# Patient Record
Sex: Female | Born: 2000 | Race: White | Hispanic: No | Marital: Single | Smoking: Never smoker
Health system: Southern US, Community
[De-identification: ages and names within clinical notes are randomized; demographics above are authoritative.]

---
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
## 2020-01-17 DIAGNOSIS — Z419 Encounter for procedure for purposes other than remedying health state, unspecified: Secondary | ICD-10-CM | POA: Diagnosis not present

## 2020-02-17 DIAGNOSIS — Z419 Encounter for procedure for purposes other than remedying health state, unspecified: Secondary | ICD-10-CM | POA: Diagnosis not present

## 2020-03-19 DIAGNOSIS — Z419 Encounter for procedure for purposes other than remedying health state, unspecified: Secondary | ICD-10-CM | POA: Diagnosis not present

## 2020-04-16 DIAGNOSIS — Z419 Encounter for procedure for purposes other than remedying health state, unspecified: Secondary | ICD-10-CM | POA: Diagnosis not present

## 2020-05-17 DIAGNOSIS — Z419 Encounter for procedure for purposes other than remedying health state, unspecified: Secondary | ICD-10-CM | POA: Diagnosis not present

## 2020-06-01 ENCOUNTER — Emergency Department
Admission: EM | Admit: 2020-06-01 | Discharge: 2020-06-01 | Disposition: A | Discharge status: Home / Self Care | Payer: Medicaid Other | Attending: Emergency Medicine | Admitting: Emergency Medicine

## 2020-06-01 ENCOUNTER — Other Ambulatory Visit: Payer: Self-pay

## 2020-06-01 ENCOUNTER — Emergency Department: Payer: Medicaid Other

## 2020-06-01 ENCOUNTER — Encounter: Payer: Self-pay | Admitting: Radiology

## 2020-06-01 DIAGNOSIS — M79605 Pain in left leg: Secondary | ICD-10-CM | POA: Insufficient documentation

## 2020-06-01 DIAGNOSIS — Y92009 Unspecified place in unspecified non-institutional (private) residence as the place of occurrence of the external cause: Secondary | ICD-10-CM | POA: Diagnosis not present

## 2020-06-01 DIAGNOSIS — M549 Dorsalgia, unspecified: Secondary | ICD-10-CM | POA: Diagnosis not present

## 2020-06-01 DIAGNOSIS — X500XXA Overexertion from strenuous movement or load, initial encounter: Secondary | ICD-10-CM | POA: Insufficient documentation

## 2020-06-01 DIAGNOSIS — M545 Low back pain, unspecified: Secondary | ICD-10-CM | POA: Diagnosis not present

## 2020-06-01 LAB — POC URINE PREG, ED: Preg Test, Ur: NEGATIVE

## 2020-06-01 MED ORDER — ACETAMINOPHEN 325 MG PO TABS
650.0000 mg | ORAL_TABLET | Freq: Once | ORAL | Status: AC
  Administered 2020-06-01: 650 mg via ORAL
  Filled 2020-06-01: qty 2

## 2020-06-01 MED ORDER — MELOXICAM 7.5 MG PO TABS
15.0000 mg | ORAL_TABLET | Freq: Once | ORAL | Status: AC
  Administered 2020-06-01: 15 mg via ORAL
  Filled 2020-06-01: qty 2

## 2020-06-01 MED ORDER — METHOCARBAMOL 500 MG PO TABS
750.0000 mg | ORAL_TABLET | Freq: Once | ORAL | Status: AC
  Administered 2020-06-01: 750 mg via ORAL
  Filled 2020-06-01: qty 2

## 2020-06-01 MED ORDER — MELOXICAM 15 MG PO TABS: 15.0000 mg | ORAL_TABLET | Freq: Every day | ORAL | 0 refills | Status: AC

## 2020-06-01 MED ORDER — METHOCARBAMOL 750 MG PO TABS: 750.0000 mg | ORAL_TABLET | Freq: Four times a day (QID) | ORAL | 0 refills | Status: AC | PRN

## 2020-06-01 NOTE — Discharge Instructions (Addendum)
Please take anti-inflammatory and muscle relaxant as prescribed.  You may also take Tylenol, up to 1000 mg 4 times daily as needed for pain.  These follow-up with primary care if not improving, or return to the emergency department with any worsening.

## 2020-06-01 NOTE — ED Triage Notes (Signed)
Pt comes pov with left hip pain to knee that started a week ago. Denies injury.

## 2020-06-01 NOTE — ED Notes (Signed)
Called and notified imaging staff that urine preg neg since not yet transferred over to chart. Stated they'll come get pt soon.

## 2020-06-01 NOTE — ED Notes (Signed)
Urine preg NEG. Unsure why result not transferring over to chart.

## 2020-06-01 NOTE — ED Provider Notes (Signed)
[facility] Emergency Department Provider Note  ____________________________________________   Event Date/Time   First MD Initiated Contact with Patient 06/01/20 1739     (approximate)  I have reviewed the triage vital signs and the nursing notes.   HISTORY  Chief Complaint Leg Pain   HPI Michele Velazquez is a 20 y.o. female who presents to the emergency department for evaluation of left leg pain.  Patient states that pain has been present for about a week and radiates from the left hip down to the left knee.  She states that it started after moving heavy furniture at her home.  She states that her back "does not hurt that bad" but she does endorse some associated back pain.  She has tried a single dose of Tylenol and a single dose of ibuprofen without improvement in her symptoms.  She reports that pain is worse when up and moving, improved with sitting in a crosslegged position.  She denies any fever, denies any loss of bowel or bladder control.  Denies any numbness or tingling or weakness.        History reviewed. No pertinent past medical history.  There are no problems to display for this patient.   No past surgical history on file.  Prior to Admission medications   Medication Sig Start Date End Date Taking? Authorizing Provider  meloxicam (MOBIC) 15 MG tablet Take 1 tablet (15 mg total) by mouth daily for 15 days. 06/01/20 06/16/20 Yes Newell Wafer, Ruben Gottron, PA  methocarbamol (ROBAXIN-750) 750 MG tablet Take 1 tablet (750 mg total) by mouth 4 (four) times daily as needed for up to 10 days for muscle spasms. 06/01/20 06/11/20 Yes Kaydence Baba, Ruben Gottron, PA  acyclovir (ZOVIRAX) 800 MG tablet Take 1 tablet (800 mg total) by mouth 3 (three) times daily. 01/30/19   Fisher, Roselyn Bering, PA-C  predniSONE (STERAPRED UNI-PAK 21 TAB) 10 MG (21) TBPK tablet Take 6 pills on day one then decrease by 1 pill each day 01/30/19   Faythe Ghee, PA-C    Allergies Penicillins,  Sulfa antibiotics, and Bactrim [sulfamethoxazole-trimethoprim]  No family history on file.  Social History Social History   Tobacco Use  . Smoking status: Never Smoker  . Smokeless tobacco: Never Used  Substance Use Topics  . Alcohol use: No    Review of Systems  Constitutional: No fever/chills Eyes: No visual changes. ENT: No sore throat. Cardiovascular: Denies chest pain. Respiratory: Denies shortness of breath. Gastrointestinal: No abdominal pain.  No nausea, no vomiting.  No diarrhea.  No constipation. Genitourinary: Negative for dysuria. Musculoskeletal: + Left leg pain, back pain Skin: Negative for rash. Neurological: Negative for headaches, focal weakness or numbness.   ____________________________________________   PHYSICAL EXAM:  VITAL SIGNS: ED Triage Vitals  Enc Vitals Group     BP 06/01/20 1736 (!) 179/100     Pulse Rate 06/01/20 1736 (!) 120     Resp 06/01/20 1736 18     Temp 06/01/20 1736 97.6 F (36.4 C)     Temp Source 06/01/20 1736 Oral     SpO2 06/01/20 1736 96 %     Weight 06/01/20 1736 (!) 336 lb (152.4 kg)     Height 06/01/20 1736 5\' 6"  (1.676 m)     Head Circumference --      Peak Flow --      Pain Score 06/01/20 1735 10     Pain Loc --      Pain Edu? --  Excl. in GC? --    Constitutional: Alert and oriented. Well appearing and in no acute distress. Eyes: Conjunctivae are normal. PERRL. EOMI. Head: Atraumatic. Nose: No congestion/rhinnorhea. Mouth/Throat: Mucous membranes are moist.  Neck: No stridor.   Cardiovascular: Normal rate, regular rhythm. Grossly normal heart sounds.  Good peripheral circulation. Respiratory: Normal respiratory effort.  No retractions. Lungs CTAB. Gastrointestinal: Soft and nontender. No distention. No abdominal bruits. No CVA tenderness. Musculoskeletal: There is tenderness noted to the midline and paraspinals of the lumbar spine.  Pain of the left leg is not reproducible to palpation.  Negative logroll  bilaterally.  Full range of motion of the hip knee and ankle bilaterally.  5/5 strength in the bilateral lower extremities and ankle plantarflexion and dorsiflexion, knee flexion and extension. Neurologic:  Normal speech and language. No gross focal neurologic deficits are appreciated.  Skin:  Skin is warm, dry and intact. No rash noted. Psychiatric: Mood and affect are normal. Speech and behavior are normal.  ____________________________________________   LABS (all labs ordered are listed, but only abnormal results are displayed)  Labs Reviewed  POC URINE PREG, ED   ____________________________________________  RADIOLOGY I, Lucy Chris, personally viewed and evaluated these images (plain radiographs) as part of my medical decision making, as well as reviewing the written report by the radiologist.  ED provider interpretation: No acute findings on lumbar film  Official radiology report(s): DG Lumbar Spine 2-3 Views  Result Date: 06/01/2020 CLINICAL DATA:  Low back pain, no known injury, initial encounter EXAM: LUMBAR SPINE - 3 VIEW COMPARISON:  None. FINDINGS: Five lumbar type vertebral bodies are well visualized. Vertebral body height is well maintained. No anterolisthesis is noted. No soft tissue abnormality is noted. IMPRESSION: No acute abnormality noted. Electronically Signed   By: Alcide Clever M.D.   On: 06/01/2020 20:11    ____________________________________________   INITIAL IMPRESSION / ASSESSMENT AND PLAN / ED COURSE  As part of my medical decision making, I reviewed the following data within the electronic MEDICAL RECORD NUMBER Nursing notes reviewed and incorporated, Radiograph reviewed and Notes from prior ED visits        Patient is a 20 year old female who presents to the emergency department for evaluation of left leg pain that is been persistent over the last week that occurred after moving some furniture.  See HPI for further details.  On physical exam, she is  noted to have tenderness to palpation of the lumbar spine, otherwise exam is grossly within normal limits.  X-ray was obtained of the low back and is negative for acute pathology.  At this time, suspect sciatica as the cause for her radiating leg pain.  Will initiate trial of anti-inflammatories and muscle relaxant and recommended scheduled Tylenol.  Return precautions were discussed, and patient stable at time for outpatient follow-up.      ____________________________________________   FINAL CLINICAL IMPRESSION(S) / ED DIAGNOSES  Final diagnoses:  Left leg pain     ED Discharge Orders         Ordered    methocarbamol (ROBAXIN-750) 750 MG tablet  4 times daily PRN        06/01/20 2023    meloxicam (MOBIC) 15 MG tablet  Daily        06/01/20 2023          *Please note:  Michele Velazquez was evaluated in Emergency Department on 06/01/2020 for the symptoms described in the history of present illness. She was evaluated in the context of  the global COVID-19 pandemic, which necessitated consideration that the patient might be at risk for infection with the SARS-CoV-2 virus that causes COVID-19. Institutional protocols and algorithms that pertain to the evaluation of patients at risk for COVID-19 are in a state of rapid change based on information released by regulatory bodies including the CDC and federal and state organizations. These policies and algorithms were followed during the patient's care in the ED.  Some ED evaluations and interventions may be delayed as a result of limited staffing during and the pandemic.*   Note:  This document was prepared using Dragon voice recognition software and may include unintentional dictation errors.   Lucy Chris, PA 06/01/20 2351    Dionne Bucy, MD 06/02/20 0040

## 2020-06-01 NOTE — ED Notes (Signed)
See triage note. Pt's "mimi" updated with pt's verbal okay. Pt alert and sitting calmly in bed. Family member at bedside. Pt A&Ox4. Resp reg/unlabored currently.

## 2020-06-16 DIAGNOSIS — Z419 Encounter for procedure for purposes other than remedying health state, unspecified: Secondary | ICD-10-CM | POA: Diagnosis not present

## 2020-07-17 DIAGNOSIS — Z419 Encounter for procedure for purposes other than remedying health state, unspecified: Secondary | ICD-10-CM | POA: Diagnosis not present

## 2020-08-16 DIAGNOSIS — Z419 Encounter for procedure for purposes other than remedying health state, unspecified: Secondary | ICD-10-CM | POA: Diagnosis not present

## 2020-09-16 DIAGNOSIS — Z419 Encounter for procedure for purposes other than remedying health state, unspecified: Secondary | ICD-10-CM | POA: Diagnosis not present

## 2020-10-17 DIAGNOSIS — Z419 Encounter for procedure for purposes other than remedying health state, unspecified: Secondary | ICD-10-CM | POA: Diagnosis not present

## 2020-11-08 ENCOUNTER — Emergency Department
Admission: EM | Admit: 2020-11-08 | Discharge: 2020-11-08 | Disposition: A | Discharge status: Home / Self Care | Payer: Medicaid Other | Attending: Emergency Medicine | Admitting: Emergency Medicine

## 2020-11-08 ENCOUNTER — Other Ambulatory Visit: Payer: Self-pay

## 2020-11-08 DIAGNOSIS — M545 Low back pain, unspecified: Secondary | ICD-10-CM | POA: Diagnosis not present

## 2020-11-08 DIAGNOSIS — M5459 Other low back pain: Secondary | ICD-10-CM | POA: Diagnosis not present

## 2020-11-08 LAB — URINALYSIS, COMPLETE (UACMP) WITH MICROSCOPIC
Bilirubin Urine: NEGATIVE
Glucose, UA: NEGATIVE mg/dL
Hgb urine dipstick: NEGATIVE
Ketones, ur: NEGATIVE mg/dL
Leukocytes,Ua: NEGATIVE
Nitrite: NEGATIVE
Protein, ur: >=300 mg/dL — AB
Specific Gravity, Urine: 1.015 (ref 1.005–1.030)
pH: 5 (ref 5.0–8.0)

## 2020-11-08 LAB — POC URINE PREG, ED: Preg Test, Ur: NEGATIVE

## 2020-11-08 MED ORDER — PREDNISONE 10 MG (21) PO TBPK: ORAL_TABLET | ORAL | 0 refills | Status: DC

## 2020-11-08 MED ORDER — PREDNISONE 20 MG PO TABS
60.0000 mg | ORAL_TABLET | Freq: Once | ORAL | Status: AC
  Administered 2020-11-08: 60 mg via ORAL
  Filled 2020-11-08: qty 3

## 2020-11-08 MED ORDER — METHOCARBAMOL 500 MG PO TABS: 500.0000 mg | ORAL_TABLET | Freq: Three times a day (TID) | ORAL | 0 refills | Status: AC | PRN

## 2020-11-08 NOTE — ED Provider Notes (Signed)
[facility]-EMERGENCY DEPARTMENT  ____________________________________________  Time seen: Approximately 10:31 PM  I have reviewed the triage vital signs and the nursing notes.   HISTORY  Chief Complaint Back Pain   Patient     HPI Michele Velazquez is a 20 y.o. female presents to the emergency department with right-sided low back pain that radiates down the posterior aspect of the lower extremity that is occurred for the past 2 to 3 days.  Patient denies dysuria, hematuria or increased urinary frequency.  No bowel or bladder incontinence or saddle anesthesia.  No falls or other mechanisms of trauma.  Patient denies nausea or vomiting at home.  Patient states that she has experienced similar symptoms in the past and was prescribed meloxicam with little relief.   No past medical history on file.   Immunizations up to date:  Yes.     No past medical history on file.  There are no problems to display for this patient.   No past surgical history on file.  Prior to Admission medications   Medication Sig Start Date End Date Taking? Authorizing Provider  methocarbamol (ROBAXIN) 500 MG tablet Take 1 tablet (500 mg total) by mouth every 8 (eight) hours as needed for up to 5 days. 11/08/20 11/13/20 Yes Pia Mau M, PA-C  acyclovir (ZOVIRAX) 800 MG tablet Take 1 tablet (800 mg total) by mouth 3 (three) times daily. 01/30/19   Fisher, Roselyn Bering, PA-C  predniSONE (STERAPRED UNI-PAK 21 TAB) 10 MG (21) TBPK tablet Take 6 tablets the first day, take 5 tablets the second day, take 4 tablets the third day, take 3 tablets the fourth day, take 2 tablets the fifth day, take 1 tablet the sixth day. 11/08/20   Orvil Feil, PA-C    Allergies Penicillins, Sulfa antibiotics, and Bactrim [sulfamethoxazole-trimethoprim]  No family history on file.  Social History Social History   Tobacco Use   Smoking status: Never   Smokeless tobacco: Never  Substance Use Topics   Alcohol use: No      Review of Systems  Constitutional: No fever/chills Eyes:  No discharge ENT: No upper respiratory complaints. Respiratory: no cough. No SOB/ use of accessory muscles to breath Gastrointestinal:   No nausea, no vomiting.  No diarrhea.  No constipation. Musculoskeletal: Patient has low back pain.  Skin: Negative for rash, abrasions, lacerations, ecchymosis.    ____________________________________________   PHYSICAL EXAM:  VITAL SIGNS: ED Triage Vitals  Enc Vitals Group     BP 11/08/20 2023 (!) 155/102     Pulse Rate 11/08/20 2023 100     Resp 11/08/20 2023 16     Temp 11/08/20 2023 98.6 F (37 C)     Temp Source 11/08/20 2023 Oral     SpO2 11/08/20 2023 100 %     Weight 11/08/20 2024 300 lb (136.1 kg)     Height 11/08/20 2024 5\' 6"  (1.676 m)     Head Circumference --      Peak Flow --      Pain Score 11/08/20 2024 10     Pain Loc --      Pain Edu? --      Excl. in GC? --      Constitutional: Alert and oriented. Well appearing and in no acute distress. Eyes: Conjunctivae are normal. PERRL. EOMI. Head: Atraumatic. ENT: Cardiovascular: Normal rate, regular rhythm. Normal S1 and S2.  Good peripheral circulation. Respiratory: Normal respiratory effort without tachypnea or retractions. Lungs CTAB. Good air entry to the  bases with no decreased or absent breath sounds Gastrointestinal: Bowel sounds x 4 quadrants. Soft and nontender to palpation. No guarding or rigidity. No distention. Musculoskeletal: Full range of motion to all extremities. No obvious deformities noted.  Patient has paraspinal muscle tenderness along the lumbar spine.  No midline lumbar spine tenderness. Neurologic:  Normal for age. No gross focal neurologic deficits are appreciated.  Skin:  Skin is warm, dry and intact. No rash noted. Psychiatric: Mood and affect are normal for age. Speech and behavior are normal.   ____________________________________________   LABS (all labs ordered are listed,  but only abnormal results are displayed)  Labs Reviewed  URINALYSIS, COMPLETE (UACMP) WITH MICROSCOPIC - Abnormal; Notable for the following components:      Result Value   Color, Urine YELLOW (*)    APPearance HAZY (*)    Protein, ur >=300 (*)    Bacteria, UA FEW (*)    All other components within normal limits  POC URINE PREG, ED   ____________________________________________  EKG   ____________________________________________  RADIOLOGY   No results found.  ____________________________________________    PROCEDURES  Procedure(s) performed:     Procedures     Medications  predniSONE (DELTASONE) tablet 60 mg (has no administration in time range)     ____________________________________________   INITIAL IMPRESSION / ASSESSMENT AND PLAN / ED COURSE  Pertinent labs & imaging results that were available during my care of the patient were reviewed by me and considered in my medical decision making (see chart for details).    Assessment and plan Low back pain 20 year old female presents to the emergency department with right-sided low back pain that radiates down the posterior aspect of the right lower extremity.  Patient was hypertensive at triage but vital signs otherwise reassuring.  On exam, patient was alert, active and nontoxic-appearing.  Patient was given first dose of prednisone in the emergency department and was discharged with tapered prednisone Axon.  All patient questions were answered.      ____________________________________________  FINAL CLINICAL IMPRESSION(S) / ED DIAGNOSES  Final diagnoses:  Acute bilateral low back pain without sciatica      NEW MEDICATIONS STARTED DURING THIS VISIT:  ED Discharge Orders          Ordered    predniSONE (STERAPRED UNI-PAK 21 TAB) 10 MG (21) TBPK tablet  Status:  Discontinued        11/08/20 2224    predniSONE (STERAPRED UNI-PAK 21 TAB) 10 MG (21) TBPK tablet        11/08/20 2225     methocarbamol (ROBAXIN) 500 MG tablet  Every 8 hours PRN        11/08/20 2225                This chart was dictated using voice recognition software/Dragon. Despite best efforts to proofread, errors can occur which can change the meaning. Any change was purely unintentional.     Orvil Feil, PA-C 11/08/20 2234    Delton Prairie, MD 11/25/20 [phone]

## 2020-11-08 NOTE — Discharge Instructions (Signed)
Take tapered steroid as directed. You can take Robaxin at night before bed.  

## 2020-11-08 NOTE — ED Triage Notes (Signed)
Pt states is having low back pain that radiates to righ thip and down right leg for several days. Pt is ambulatory slowly. Pt denies loss of bowel or bladder, no fever.

## 2020-11-16 DIAGNOSIS — Z419 Encounter for procedure for purposes other than remedying health state, unspecified: Secondary | ICD-10-CM | POA: Diagnosis not present

## 2020-12-10 DIAGNOSIS — H5213 Myopia, bilateral: Secondary | ICD-10-CM | POA: Diagnosis not present

## 2020-12-17 DIAGNOSIS — Z419 Encounter for procedure for purposes other than remedying health state, unspecified: Secondary | ICD-10-CM | POA: Diagnosis not present

## 2021-01-16 DIAGNOSIS — Z419 Encounter for procedure for purposes other than remedying health state, unspecified: Secondary | ICD-10-CM | POA: Diagnosis not present

## 2021-02-16 DIAGNOSIS — Z419 Encounter for procedure for purposes other than remedying health state, unspecified: Secondary | ICD-10-CM | POA: Diagnosis not present

## 2021-03-19 DIAGNOSIS — Z419 Encounter for procedure for purposes other than remedying health state, unspecified: Secondary | ICD-10-CM | POA: Diagnosis not present

## 2021-04-02 ENCOUNTER — Encounter: Payer: Self-pay | Admitting: Emergency Medicine

## 2021-04-02 ENCOUNTER — Other Ambulatory Visit: Payer: Self-pay

## 2021-04-02 DIAGNOSIS — Z20822 Contact with and (suspected) exposure to covid-19: Secondary | ICD-10-CM | POA: Insufficient documentation

## 2021-04-02 DIAGNOSIS — B9789 Other viral agents as the cause of diseases classified elsewhere: Secondary | ICD-10-CM | POA: Diagnosis not present

## 2021-04-02 DIAGNOSIS — J029 Acute pharyngitis, unspecified: Secondary | ICD-10-CM | POA: Insufficient documentation

## 2021-04-02 DIAGNOSIS — R519 Headache, unspecified: Secondary | ICD-10-CM | POA: Diagnosis not present

## 2021-04-02 LAB — GROUP A STREP BY PCR: Group A Strep by PCR: NOT DETECTED

## 2021-04-02 NOTE — ED Triage Notes (Signed)
Pt to ED from home c/o sore throat that started this afternoon, denies fevers or cough.

## 2021-04-03 ENCOUNTER — Emergency Department
Admission: EM | Admit: 2021-04-03 | Discharge: 2021-04-03 | Disposition: A | Discharge status: Home / Self Care | Payer: Medicaid Other | Attending: Emergency Medicine | Admitting: Emergency Medicine

## 2021-04-03 DIAGNOSIS — J029 Acute pharyngitis, unspecified: Secondary | ICD-10-CM

## 2021-04-03 LAB — RESP PANEL BY RT-PCR (FLU A&B, COVID) ARPGX2
Influenza A by PCR: NEGATIVE
Influenza B by PCR: NEGATIVE
SARS Coronavirus 2 by RT PCR: NEGATIVE

## 2021-04-03 MED ORDER — NAPROXEN 500 MG PO TABS
500.0000 mg | ORAL_TABLET | Freq: Once | ORAL | Status: AC
  Administered 2021-04-03: 500 mg via ORAL
  Filled 2021-04-03: qty 1

## 2021-04-03 MED ORDER — ACETAMINOPHEN 500 MG PO TABS
1000.0000 mg | ORAL_TABLET | Freq: Once | ORAL | Status: AC
  Administered 2021-04-03: 1000 mg via ORAL
  Filled 2021-04-03: qty 2

## 2021-04-03 NOTE — ED Provider Notes (Signed)
[facility] Provider Note    Event Date/Time   First MD Initiated Contact with Patient 04/03/21 0005     (approximate)   History   Sore Throat   HPI  Michele Velazquez is a 21 y.o. female who presents to the ED for evaluation of Sore Throat   Morbidly obese patient.  Patient presents to the ED for evaluation of sore throat this afternoon.  She reports developing "the sniffles" yesterday and developed sore throat today, she reports explicit concern for strep throat as she had this recurrently when she was a child.  She reports a sick contact with her little brother who is a similar syndrome with congestion and nasal drainage in the past few days.  She reports a mild global headache without syncope, dizziness or falls.  Denies chest pain, shortness of breath, palpitations, abdominal pain, emesis or nausea.  Denies cough.   Physical Exam   Triage Vital Signs: ED Triage Vitals  Enc Vitals Group     BP 04/02/21 2315 (!) 151/91     Pulse Rate 04/02/21 2315 (!) 107     Resp 04/02/21 2315 16     Temp 04/02/21 2315 98.4 F (36.9 C)     Temp Source 04/02/21 2315 Oral     SpO2 04/02/21 2315 98 %     Weight 04/02/21 2323 299 lb 13.2 oz (136 kg)     Height 04/02/21 2323 5\' 6"  (1.676 m)     Head Circumference --      Peak Flow --      Pain Score 04/02/21 2323 6     Pain Loc --      Pain Edu? --      Excl. in GC? --     Most recent vital signs: Vitals:   04/02/21 2315  BP: (!) 151/91  Pulse: (!) 107  Resp: 16  Temp: 98.4 F (36.9 C)  SpO2: 98%    General: Awake, no distress.  Morbidly obese.  Conversational in full sentences with a normal voice.  Ambulatory with normal gait CV:  Good peripheral perfusion.  RRR Resp:  Normal effort.  CTA B Abd:  No distention.  Soft and benign throughout MSK:  No deformity noted.  Neuro:  No focal deficits appreciated. Other:  Up respiratory congestion is present.  Occasional sniffles during our conversation.   Uvula is midline and tonsils are 2+ bilaterally without exudate.  Mild posterior pharyngeal erythema   ED Results / Procedures / Treatments   Labs (all labs ordered are listed, but only abnormal results are displayed) Labs Reviewed  RESP PANEL BY RT-PCR (FLU A&B, COVID) ARPGX2  GROUP A STREP BY PCR    EKG   RADIOLOGY   Official radiology report(s): No results found.  PROCEDURES and INTERVENTIONS:  Procedures  Medications  acetaminophen (TYLENOL) tablet 1,000 mg (has no administration in time range)  naproxen (NAPROSYN) tablet 500 mg (has no administration in time range)     IMPRESSION / MDM / ASSESSMENT AND PLAN / ED COURSE  I reviewed the triage vital signs and the nursing notes.  21 year old female presents to the ED with a sore throat and upper respiratory congestion, likely viral syndrome suitable for outpatient management.  She looks clinically well to me.  She is phonating normally without evidence of upper airway obstruction.  Suspect a viral syndrome.  Testing negative for flu and COVID, as well as negative for strep.  Notifications for antibiotics.  We discussed OTC  medications and return precautions.      FINAL CLINICAL IMPRESSION(S) / ED DIAGNOSES   Final diagnoses:  Viral pharyngitis     Rx / DC Orders   ED Discharge Orders     None        Note:  This document was prepared using Dragon voice recognition software and may include unintentional dictation errors.   Delton Prairie, MD 04/03/21 [phone]

## 2021-04-03 NOTE — Discharge Instructions (Signed)
Please take Tylenol and ibuprofen/Advil for your pain.  It is safe to take them together, or to alternate them every few hours.  Take up to 1000mg of Tylenol at a time, up to 4 times per day.  Do not take more than 4000 mg of Tylenol in 24 hours.  For ibuprofen, take 400-600 mg, 4-5 times per day. ° ° °

## 2021-04-12 ENCOUNTER — Emergency Department
Admission: EM | Admit: 2021-04-12 | Discharge: 2021-04-13 | Disposition: A | Discharge status: Home / Self Care | Payer: Medicaid Other | Attending: Emergency Medicine | Admitting: Emergency Medicine

## 2021-04-12 ENCOUNTER — Other Ambulatory Visit: Payer: Self-pay

## 2021-04-12 ENCOUNTER — Emergency Department: Payer: Medicaid Other

## 2021-04-12 DIAGNOSIS — R9431 Abnormal electrocardiogram [ECG] [EKG]: Secondary | ICD-10-CM | POA: Diagnosis not present

## 2021-04-12 DIAGNOSIS — L539 Erythematous condition, unspecified: Secondary | ICD-10-CM

## 2021-04-12 DIAGNOSIS — L0291 Cutaneous abscess, unspecified: Secondary | ICD-10-CM

## 2021-04-12 DIAGNOSIS — N611 Abscess of the breast and nipple: Secondary | ICD-10-CM | POA: Insufficient documentation

## 2021-04-12 DIAGNOSIS — N6320 Unspecified lump in the left breast, unspecified quadrant: Secondary | ICD-10-CM

## 2021-04-12 DIAGNOSIS — N6489 Other specified disorders of breast: Secondary | ICD-10-CM | POA: Diagnosis not present

## 2021-04-12 LAB — CBC WITH DIFFERENTIAL/PLATELET
Abs Immature Granulocytes: 0.02 10*3/uL (ref 0.00–0.07)
Basophils Absolute: 0 10*3/uL (ref 0.0–0.1)
Basophils Relative: 0 %
Eosinophils Absolute: 0 10*3/uL (ref 0.0–0.5)
Eosinophils Relative: 1 %
HCT: 42.4 % (ref 36.0–46.0)
Hemoglobin: 13 g/dL (ref 12.0–15.0)
Immature Granulocytes: 0 %
Lymphocytes Relative: 36 %
Lymphs Abs: 2.3 10*3/uL (ref 0.7–4.0)
MCH: 25.3 pg — ABNORMAL LOW (ref 26.0–34.0)
MCHC: 30.7 g/dL (ref 30.0–36.0)
MCV: 82.5 fL (ref 80.0–100.0)
Monocytes Absolute: 0.4 10*3/uL (ref 0.1–1.0)
Monocytes Relative: 6 %
Neutro Abs: 3.6 10*3/uL (ref 1.7–7.7)
Neutrophils Relative %: 57 %
Platelets: 334 10*3/uL (ref 150–400)
RBC: 5.14 MIL/uL — ABNORMAL HIGH (ref 3.87–5.11)
RDW: 14.2 % (ref 11.5–15.5)
WBC: 6.3 10*3/uL (ref 4.0–10.5)
nRBC: 0 % (ref 0.0–0.2)

## 2021-04-12 LAB — BASIC METABOLIC PANEL
Anion gap: 10 (ref 5–15)
BUN: 13 mg/dL (ref 6–20)
CO2: 25 mmol/L (ref 22–32)
Calcium: 9 mg/dL (ref 8.9–10.3)
Chloride: 103 mmol/L (ref 98–111)
Creatinine, Ser: 0.9 mg/dL (ref 0.44–1.00)
GFR, Estimated: >60 mL/min (ref >60)
Glucose, Bld: 147 mg/dL — ABNORMAL HIGH (ref 70–99)
Potassium: 4 mmol/L (ref 3.5–5.1)
Sodium: 138 mmol/L (ref 135–145)

## 2021-04-12 MED ORDER — LIDOCAINE HCL (PF) 1 % IJ SOLN
5.0000 mL | Freq: Once | INTRAMUSCULAR | Status: AC
Start: 2021-04-12 — End: 2021-04-13
  Administered 2021-04-13: 5 mL via INTRADERMAL
  Filled 2021-04-12: qty 5

## 2021-04-12 NOTE — ED Provider Triage Note (Signed)
Emergency Medicine Provider Triage Evaluation Note  Michele Velazquez , a 21 y.o. female  was evaluated in triage.  Pt complains of large lump noted on the left breast.  Family history of breast cancer.  Symptoms x4 days.  Review of Systems  Positive: Breast lump Negative: Fever, chills  Physical Exam  Ht 5\' 6"  (1.676 m)    Wt 136.1 kg    BMI 48.42 kg/m  Gen:   Awake, no distress   Resp:  Normal effort  MSK:   Moves extremities without difficulty  Other:  Left breast has a fluctuant area on the outer 3:00  Medical Decision Making  Medically screening exam initiated at 5:57 PM.  Appropriate orders placed.  Michele Velazquez was informed that the remainder of the evaluation will be completed by another provider, this initial triage assessment does not replace that evaluation, and the importance of remaining in the ED until their evaluation is complete.  Labs and ultrasound for abscess of the breast    Versie Starks, PA-C 04/12/21 1758

## 2021-04-12 NOTE — ED Triage Notes (Signed)
Pt states she noticed a lump in left breast 4 days ago that has gotten larger. Pt states 8 people in her family have a history of breast cancer. Pt states she is concerned about cancer. Pt states it hurts to touch. Pt states she is not breastfeeding. Pt is on her period currently.  Pt with 2 inch sized lump on left outer breast that is warm to touch and tender.

## 2021-04-13 MED ORDER — CLINDAMYCIN HCL 300 MG PO CAPS: 300.0000 mg | ORAL_CAPSULE | Freq: Three times a day (TID) | ORAL | 0 refills | Status: AC

## 2021-04-13 NOTE — ED Provider Notes (Signed)
----------------------------------------- °  12:07 AM on 04/13/2021 -----------------------------------------  I took over care on this patient from Dr. Vicente Males.  She has a small left breast abscess diagnosed on ultrasound.  I consulted Dr. Everlene Farrier from general surgery and discussed the case with him.  He recommended a needle aspiration at the bedside, which I performed successfully with return of purulent fluid.  The patient tolerated the procedure well.  She is stable for discharge home at this time.  Dr. Everlene Farrier recommended starting the patient on antibiotics; she has anaphylaxis to penicillin so I have prescribed clindamycin.  The patient is stable for discharge home.  Return precautions given, and she expresses understanding.  Marland Kitchen.Incision and Drainage  Date/Time: 04/13/2021 12:09 AM Performed by: Dionne Bucy, MD Authorized by: Dionne Bucy, MD   Consent:    Consent obtained:  Verbal   Consent given by:  Patient   Risks discussed:  Bleeding, infection, incomplete drainage and pain   Alternatives discussed:  Alternative treatment, delayed treatment and observation Universal protocol:    Patient identity confirmed:  Verbally with patient Location:    Type:  Abscess   Size:  3cm   Location:  Trunk   Trunk location:  L breast Anesthesia:    Anesthesia method:  Local infiltration   Local anesthetic:  Lidocaine 1% w/o epi Procedure type:    Complexity:  Simple Procedure details:    Needle aspiration: yes     Needle size:  18 G   Drainage:  Bloody and purulent   Wound treatment:  Wound left open   Packing materials:  None Post-procedure details:    Procedure completion:  Tolerated well, no immediate complications    Dionne Bucy, MD 04/13/21 0009

## 2021-04-13 NOTE — Discharge Instructions (Signed)
Take the antibiotic as prescribed and finish the full course.  Follow-up with the surgeon in 1 to 2 weeks.  Return to the ER for new, worsening, or persistent severe pain, swelling, bleeding, pus drainage, worsening rash or redness, fever, or any other new or worsening symptoms that concern you.

## 2021-04-16 DIAGNOSIS — Z419 Encounter for procedure for purposes other than remedying health state, unspecified: Secondary | ICD-10-CM | POA: Diagnosis not present

## 2021-04-29 NOTE — ED Provider Notes (Signed)
[facility] Provider Note   Event Date/Time   First MD Initiated Contact with Patient 04/12/21 1905     (approximate) History  Breast Mass  HPI Michele Velazquez is a 21 y.o. female with no stated past medical history who presents for a lump to her left breast that has been present over the last 4 days.  Patient states that this mass has become larger over this time and is concerned as she has had a [place] women in her family with a history of breast cancer.  Patient states that it does hurt to touch.  Patient is not breast-feeding and is on her period currently.  Patient describes a 2 inch diameter lump to the left outer breast that is warm and tender to palpation.  Patient denies any fevers, chest pain, nipple discharge, shortness of breath, weakness/numbness/paresthesias in any extremity. Physical Exam  Triage Vital Signs: ED Triage Vitals  Enc Vitals Group     BP 04/12/21 1800 (!) 145/88     Pulse Rate 04/12/21 1757 79     Resp 04/12/21 1757 18     Temp 04/12/21 1757 98.6 F (37 C)     Temp Source 04/12/21 1757 Oral     SpO2 04/12/21 1757 96 %     Weight 04/12/21 1756 300 lb (136.1 kg)     Height 04/12/21 1756 5\' 6"  (1.676 m)     Head Circumference --      Peak Flow --      Pain Score 04/12/21 1756 0     Pain Loc --      Pain Edu? --      Excl. in [place]? --    Most recent vital signs: Vitals:   04/12/21 2231 04/13/21 0047  BP: 106/71 (!) 113/51  Pulse: 87 90  Resp: 16 18  Temp:    SpO2: 100% 97%   General: Awake, oriented x4. CV:  Good peripheral perfusion.  Resp:  Normal effort.  Abd:  No distention.  Other:  Young adult morbidly obese Caucasian female laying in bed in no distress.  2 inch diameter mass appreciated to the left outer breast with erythema and tenderness to palpation ED Results / Procedures / Treatments  Labs (all labs ordered are listed, but only abnormal results are displayed) Labs Reviewed  BASIC METABOLIC PANEL - Abnormal;  Notable for the following components:      Result Value   Glucose, Bld 147 (*)    All other components within normal limits  CBC WITH DIFFERENTIAL/PLATELET - Abnormal; Notable for the following components:   RBC 5.14 (*)    MCH 25.3 (*)    All other components within normal limits   RADIOLOGY ED MD interpretation: Patient pending a ultrasound of the left breast at the time of signout  Official radiology report(s): No results found. PROCEDURES: Critical Care performed: No .1-3 Lead EKG Interpretation Performed by: Merwyn Katos, MD Authorized by: Merwyn Katos, MD     Interpretation: normal     ECG rate:  89   ECG rate assessment: normal     Rhythm: sinus rhythm     Ectopy: none     Conduction: normal   MEDICATIONS ORDERED IN ED: Medications  lidocaine (PF) (XYLOCAINE) 1 % injection 5 mL (5 mLs Intradermal Given 04/13/21 0046)   IMPRESSION / MDM / ASSESSMENT AND PLAN / ED COURSE  I reviewed the triage vital signs and the nursing notes.  Differential diagnosis includes, but is not limited to, breast cancer, prior cystic breast changes, breast abscess, breast hematoma The patient is on the cardiac monitor to evaluate for evidence of arrhythmia and/or significant heart rate changes.  Patient is a 21 year old female with the above-stated past medical history who presents for a mass to her left breast that is been present for the last 4 days in the setting of positive family history of breast cancer.  Patient is very concerned for breast cancer.  Physical examination shows a erythematous left breast with tenderness to palpation concerning for possible abscess.  Patient is pending a ultrasound at the time of signout.  Care of this patient will be signed out to the oncoming physician at the end of my shift.  All pertinent patient information conveyed and all questions answered.  All further care and disposition decisions will be made by the oncoming  physician.   FINAL CLINICAL IMPRESSION(S) / ED DIAGNOSES   Final diagnoses:  Mass of breast, left  Left breast abscess   Rx / DC Orders   ED Discharge Orders          Ordered    clindamycin (CLEOCIN) 300 MG capsule  3 times daily        04/13/21 0007           Note:  This document was prepared using Dragon voice recognition software and may include unintentional dictation errors.   Merwyn Katos, MD 04/29/21 [phone]

## 2021-05-17 DIAGNOSIS — Z419 Encounter for procedure for purposes other than remedying health state, unspecified: Secondary | ICD-10-CM | POA: Diagnosis not present

## 2021-06-16 DIAGNOSIS — Z419 Encounter for procedure for purposes other than remedying health state, unspecified: Secondary | ICD-10-CM | POA: Diagnosis not present

## 2021-07-17 DIAGNOSIS — Z419 Encounter for procedure for purposes other than remedying health state, unspecified: Secondary | ICD-10-CM | POA: Diagnosis not present

## 2021-08-16 DIAGNOSIS — Z419 Encounter for procedure for purposes other than remedying health state, unspecified: Secondary | ICD-10-CM | POA: Diagnosis not present

## 2021-09-16 DIAGNOSIS — Z419 Encounter for procedure for purposes other than remedying health state, unspecified: Secondary | ICD-10-CM | POA: Diagnosis not present

## 2021-10-17 DIAGNOSIS — Z419 Encounter for procedure for purposes other than remedying health state, unspecified: Secondary | ICD-10-CM | POA: Diagnosis not present

## 2021-11-07 ENCOUNTER — Encounter: Payer: Self-pay | Admitting: Emergency Medicine

## 2021-11-07 ENCOUNTER — Emergency Department: Payer: Medicaid Other

## 2021-11-07 ENCOUNTER — Other Ambulatory Visit: Payer: Self-pay

## 2021-11-07 ENCOUNTER — Observation Stay
Admission: EM | Admit: 2021-11-07 | Discharge: 2021-11-08 | Disposition: A | Discharge status: Home / Self Care | Payer: Medicaid Other | Attending: Internal Medicine | Admitting: Internal Medicine

## 2021-11-07 DIAGNOSIS — Z881 Allergy status to other antibiotic agents status: Secondary | ICD-10-CM

## 2021-11-07 DIAGNOSIS — Z23 Encounter for immunization: Secondary | ICD-10-CM | POA: Insufficient documentation

## 2021-11-07 DIAGNOSIS — J351 Hypertrophy of tonsils: Secondary | ICD-10-CM | POA: Diagnosis present

## 2021-11-07 DIAGNOSIS — Z6841 Body Mass Index (BMI) 40.0 and over, adult: Secondary | ICD-10-CM | POA: Diagnosis not present

## 2021-11-07 DIAGNOSIS — R030 Elevated blood-pressure reading, without diagnosis of hypertension: Secondary | ICD-10-CM | POA: Diagnosis not present

## 2021-11-07 DIAGNOSIS — K122 Cellulitis and abscess of mouth: Secondary | ICD-10-CM | POA: Diagnosis not present

## 2021-11-07 DIAGNOSIS — I889 Nonspecific lymphadenitis, unspecified: Secondary | ICD-10-CM | POA: Diagnosis not present

## 2021-11-07 DIAGNOSIS — Z880 Allergy status to penicillin: Secondary | ICD-10-CM

## 2021-11-07 DIAGNOSIS — R000 Tachycardia, unspecified: Secondary | ICD-10-CM | POA: Diagnosis present

## 2021-11-07 DIAGNOSIS — R220 Localized swelling, mass and lump, head: Secondary | ICD-10-CM | POA: Diagnosis present

## 2021-11-07 DIAGNOSIS — R590 Localized enlarged lymph nodes: Secondary | ICD-10-CM | POA: Diagnosis not present

## 2021-11-07 DIAGNOSIS — E66813 Obesity, class 3: Secondary | ICD-10-CM | POA: Diagnosis present

## 2021-11-07 LAB — CBC WITH DIFFERENTIAL/PLATELET
Abs Immature Granulocytes: 0.03 10*3/uL (ref 0.00–0.07)
Basophils Absolute: 0.1 10*3/uL (ref 0.0–0.1)
Basophils Relative: 1 %
Eosinophils Absolute: 0.2 10*3/uL (ref 0.0–0.5)
Eosinophils Relative: 2 %
HCT: 39.4 % (ref 36.0–46.0)
Hemoglobin: 12.1 g/dL (ref 12.0–15.0)
Immature Granulocytes: 0 %
Lymphocytes Relative: 24 %
Lymphs Abs: 2.5 10*3/uL (ref 0.7–4.0)
MCH: 25.1 pg — ABNORMAL LOW (ref 26.0–34.0)
MCHC: 30.7 g/dL (ref 30.0–36.0)
MCV: 81.6 fL (ref 80.0–100.0)
Monocytes Absolute: 0.6 10*3/uL (ref 0.1–1.0)
Monocytes Relative: 6 %
Neutro Abs: 7.3 10*3/uL (ref 1.7–7.7)
Neutrophils Relative %: 67 %
Platelets: 412 10*3/uL — ABNORMAL HIGH (ref 150–400)
RBC: 4.83 MIL/uL (ref 3.87–5.11)
RDW: 15.8 % — ABNORMAL HIGH (ref 11.5–15.5)
WBC: 10.7 10*3/uL — ABNORMAL HIGH (ref 4.0–10.5)
nRBC: 0 % (ref 0.0–0.2)

## 2021-11-07 LAB — HIV ANTIBODY (ROUTINE TESTING W REFLEX): HIV Screen 4th Generation wRfx: NONREACTIVE

## 2021-11-07 LAB — BASIC METABOLIC PANEL
Anion gap: 12 (ref 5–15)
BUN: 12 mg/dL (ref 6–20)
CO2: 24 mmol/L (ref 22–32)
Calcium: 9.3 mg/dL (ref 8.9–10.3)
Chloride: 103 mmol/L (ref 98–111)
Creatinine, Ser: 0.96 mg/dL (ref 0.44–1.00)
GFR, Estimated: >60 mL/min (ref >60)
Glucose, Bld: 138 mg/dL — ABNORMAL HIGH (ref 70–99)
Potassium: 4 mmol/L (ref 3.5–5.1)
Sodium: 139 mmol/L (ref 135–145)

## 2021-11-07 LAB — C-REACTIVE PROTEIN: CRP: 9.1 mg/dL — ABNORMAL HIGH (ref <1.0)

## 2021-11-07 LAB — SEDIMENTATION RATE: Sed Rate: 60 mm/hr — ABNORMAL HIGH (ref 0–20)

## 2021-11-07 MED ORDER — MORPHINE SULFATE (PF) 2 MG/ML IV SOLN: 2.0000 mg | INTRAVENOUS | Status: DC | PRN

## 2021-11-07 MED ORDER — IOHEXOL 300 MG/ML  SOLN
100.0000 mL | Freq: Once | INTRAMUSCULAR | Status: AC | PRN
  Administered 2021-11-07: 100 mL via INTRAVENOUS

## 2021-11-07 MED ORDER — ENOXAPARIN SODIUM 80 MG/0.8ML IJ SOSY
0.5000 mg/kg | PREFILLED_SYRINGE | INTRAMUSCULAR | Status: DC
  Filled 2021-11-07 (×2): qty 0.68

## 2021-11-07 MED ORDER — SODIUM CHLORIDE 0.9 % IV SOLN
2.0000 g | INTRAVENOUS | Status: DC
  Administered 2021-11-07 – 2021-11-08 (×2): 2 g via INTRAVENOUS
  Filled 2021-11-07: qty 2
  Filled 2021-11-07: qty 20

## 2021-11-07 MED ORDER — SODIUM CHLORIDE 0.9 % IV SOLN: INTRAVENOUS | Status: DC

## 2021-11-07 MED ORDER — ONDANSETRON HCL 4 MG/2ML IJ SOLN: 4.0000 mg | Freq: Four times a day (QID) | INTRAMUSCULAR | Status: DC | PRN

## 2021-11-07 MED ORDER — HYDRALAZINE HCL 20 MG/ML IJ SOLN: 10.0000 mg | INTRAMUSCULAR | Status: DC | PRN

## 2021-11-07 MED ORDER — METRONIDAZOLE 500 MG/100ML IV SOLN: 500.0000 mg | Freq: Two times a day (BID) | INTRAVENOUS | Status: DC

## 2021-11-07 MED ORDER — ACETAMINOPHEN 325 MG PO TABS: 650.0000 mg | ORAL_TABLET | Freq: Four times a day (QID) | ORAL | Status: DC | PRN

## 2021-11-07 MED ORDER — METRONIDAZOLE 500 MG/100ML IV SOLN
500.0000 mg | Freq: Two times a day (BID) | INTRAVENOUS | Status: DC
  Administered 2021-11-07 – 2021-11-08 (×3): 500 mg via INTRAVENOUS
  Filled 2021-11-07 (×3): qty 100

## 2021-11-07 MED ORDER — LEVOFLOXACIN IN D5W 750 MG/150ML IV SOLN: 750.0000 mg | INTRAVENOUS | Status: DC

## 2021-11-07 MED ORDER — ACETAMINOPHEN 650 MG RE SUPP: 650.0000 mg | Freq: Four times a day (QID) | RECTAL | Status: DC | PRN

## 2021-11-07 MED ORDER — ENOXAPARIN SODIUM 40 MG/0.4ML IJ SOSY: 40.0000 mg | PREFILLED_SYRINGE | INTRAMUSCULAR | Status: DC

## 2021-11-07 MED ORDER — DEXAMETHASONE SODIUM PHOSPHATE 10 MG/ML IJ SOLN
10.0000 mg | Freq: Once | INTRAMUSCULAR | Status: AC
Start: 2021-11-07 — End: 2021-11-07
  Administered 2021-11-07: 10 mg via INTRAVENOUS
  Filled 2021-11-07: qty 1

## 2021-11-07 MED ORDER — HYDROCODONE-ACETAMINOPHEN 5-325 MG PO TABS: 1.0000 | ORAL_TABLET | ORAL | Status: DC | PRN

## 2021-11-07 MED ORDER — POLYETHYLENE GLYCOL 3350 17 G PO PACK: 17.0000 g | PACK | Freq: Every day | ORAL | Status: DC | PRN

## 2021-11-07 MED ORDER — ONDANSETRON HCL 4 MG PO TABS: 4.0000 mg | ORAL_TABLET | Freq: Four times a day (QID) | ORAL | Status: DC | PRN

## 2021-11-07 NOTE — ED Triage Notes (Signed)
Right lower lip swelling this morning.  States under chin swelling and difficulty swallowing this morning.  STates it "fells like something is stuck".    AAOx3.  Small amount of swelling appreciated to right lower lip.  Managing secretions, voice clear and strong. NAD

## 2021-11-07 NOTE — Plan of Care (Signed)

## 2021-11-07 NOTE — Consult Note (Signed)
Michele Velazquez, Michele Velazquez [id number] [birth year] Riley Nearing, MD  Reason for Consult: Neck swelling Requesting Physician: Caryn Bee,* Consulting Physician: Riley Nearing, MD  HPI: This 21 y.o. year old female was admitted on 11/07/2021 for Cellulitis of submandibular region [K12.2].  I was contacted by the ER, Dr. Charna Archer, for an opinion about management of this patient.  She had an onset of anterior neck swelling today with no preceding sore throat, fever, dental pain or URI symptoms.  She did have an ulcer come up in the lower lip on the right side, first noted today.  CT showed no evidence of any dental pathology, salivary stone or obvious source of infection, just submental lymphadenopathy with some reactive changes in the soft tissues of the neck.  No drainable abscess.  She complained of some globus sensation while in the emergency room but since admission she says all of her symptoms are much better with no difficulty swallowing.  Medications:  Current Facility-Administered Medications  Medication Dose Route Frequency Provider Last Rate Last Admin   0.9 %  sodium chloride infusion   Intravenous Continuous Caryn Bee, MD       acetaminophen (TYLENOL) tablet 650 mg  650 mg Oral Q6H PRN Caryn Bee, MD       Or   acetaminophen (TYLENOL) suppository 650 mg  650 mg Rectal Q6H PRN Caryn Bee, MD       cefTRIAXone (ROCEPHIN) 2 g in sodium chloride 0.9 % 100 mL IVPB  2 g Intravenous Q24H Caryn Bee, MD   Stopped at 11/07/21 1345   And   metroNIDAZOLE (FLAGYL) IVPB 500 mg  500 mg Intravenous Q12H Caryn Bee, MD   Stopped at 11/07/21 1439   enoxaparin (LOVENOX) injection 67.5 mg  0.5 mg/kg Subcutaneous Q24H Dallie Piles, RPH       hydrALAZINE (APRESOLINE) injection 10 mg  10 mg Intravenous Q4H PRN Caryn Bee, MD       HYDROcodone-acetaminophen (NORCO/VICODIN) 5-325 MG per tablet 1-2 tablet  1-2 tablet Oral Q4H PRN  Caryn Bee, MD       morphine (PF) 2 MG/ML injection 2 mg  2 mg Intravenous Q2H PRN Caryn Bee, MD       ondansetron [facility]) tablet 4 mg  4 mg Oral Q6H PRN Caryn Bee, MD       Or   ondansetron [facility]) injection 4 mg  4 mg Intravenous Q6H PRN Caryn Bee, MD       polyethylene glycol (MIRALAX / GLYCOLAX) packet 17 g  17 g Oral Daily PRN Caryn Bee, MD      .  Medications Prior to Admission  Medication Sig Dispense Refill   acyclovir (ZOVIRAX) 800 MG tablet Take 1 tablet (800 mg total) by mouth 3 (three) times daily. (Patient not taking: Reported on 04/12/2021) 21 tablet 0   predniSONE (STERAPRED UNI-PAK 21 TAB) 10 MG (21) TBPK tablet Take 6 tablets the first day, take 5 tablets the second day, take 4 tablets the third day, take 3 tablets the fourth day, take 2 tablets the fifth day, take 1 tablet the sixth day. (Patient not taking: Reported on 04/12/2021) 21 tablet 0    Allergies:  Allergies  Allergen Reactions   Penicillins Anaphylaxis   Sulfa Antibiotics Hives   Bactrim [Sulfamethoxazole-Trimethoprim] Rash    PMH: History reviewed. No pertinent past medical history.  Fam Hx: History reviewed. No pertinent family history.  Soc Hx:  Social  History   Socioeconomic History   Marital status: Single    Spouse name: Not on file   Number of children: Not on file   Years of education: Not on file   Highest education level: Not on file  Occupational History   Not on file  Tobacco Use   Smoking status: Never   Smokeless tobacco: Never  Substance and Sexual Activity   Alcohol use: No   Drug use: Not on file   Sexual activity: Not on file  Other Topics Concern   Not on file  Social History Narrative   Not on file   Social Determinants of Health   Financial Resource Strain: Not on file  Food Insecurity: No Food Insecurity (11/07/2021)   Hunger Vital Sign    Worried About Running Out of Food in the Last Year: Never  true    Ran Out of Food in the Last Year: Never true  Transportation Needs: No Transportation Needs (11/07/2021)   PRAPARE - Hydrologist (Medical): No    Lack of Transportation (Non-Medical): No  Physical Activity: Not on file  Stress: Not on file  Social Connections: Not on file  Intimate Partner Violence: Not At Risk (11/07/2021)   Humiliation, Afraid, Rape, and Kick questionnaire    Fear of Current or Ex-Partner: No    Emotionally Abused: No    Physically Abused: No    Sexually Abused: No    PSH: History reviewed. No pertinent surgical history.. Procedures since admission: No admission procedures for hospital encounter.  ROS: Review of systems normal other than 12 systems except per HPI.  PHYSICAL EXAM  Vitals: Blood pressure 137/72, pulse (!) 104, temperature 98.9 F (37.2 C), resp. rate 16, height 5\' 6"  (1.676 m), weight (!) 136.1 kg, last menstrual period 10/19/2021, SpO2 99 %.. General: Well-developed, Well-nourished in no acute distress Mood: Mood and affect well adjusted, pleasant and cooperative. Orientation: Grossly alert and oriented. Vocal Quality: No hoarseness. Communicates verbally. head and Face: NCAT. No facial asymmetry. No visible skin lesions. No significant facial scars. No tenderness with sinus percussion. Facial strength normal and symmetric. Ears: External ears with normal landmarks, no lesions. External auditory canals free of infection, cerumen impaction or lesions. Tympanic membranes intact with good landmarks and normal mobility on pneumatic otoscopy. No middle ear effusion. Hearing: Speech reception grossly normal. Nose: External nose normal with midline dorsum and no lesions or deformity. Nasal Cavity reveals essentially midline septum with normal inferior turbinates. No significant mucosal congestion or erythema. Nasal secretions are minimal and clear. No polyps seen on anterior rhinoscopy. Oral Cavity/ Oropharynx: There is a  small vague shallow ulcer of the right lower lip may be 8 mm at most in size. Teeth no frank dental caries. Gingiva healthy with no lesions or gingivitis. Oropharynx reveals 3+ cryptic tonsils without exudate, maybe some minor erythema.  The tongue and floor mouth are without lesions.  No swelling in the floor of the mouth. Indirect Laryngoscopy/Nasopharyngoscopy: Visualization of the larynx, hypopharynx and nasopharynx is not possible in this setting with routine examination. Neck: Supple and symmetric with no palpable masses, tenderness or crepitance. The trachea is midline. Thyroid gland is soft, nontender and symmetric with no masses or enlargement. Parotid and submandibular glands are soft, nontender and symmetric, without masses. Lymphatic: Fullness in the submental region which corresponds with the enlarged lymph nodes noted on CT scan.  No significant tenderness. Respiratory: Normal respiratory effort without labored breathing. Cardiovascular: Carotid pulse shows regular  rate and rhythm Neurologic: Cranial Nerves II through XII are grossly intact. Eyes: Gaze and Ocular Motility are grossly normal. PERRLA. No visible nystagmus.  MEDICAL DECISION MAKING: Data Review:  Results for orders placed or performed during the hospital encounter of 11/07/21 (from the past 48 hour(s))  CBC with Differential     Status: Abnormal   Collection Time: 11/07/21  9:48 AM  Result Value Ref Range   WBC 10.7 (H) 4.0 - 10.5 K/uL   RBC 4.83 3.87 - 5.11 MIL/uL   Hemoglobin 12.1 12.0 - 15.0 g/dL   HCT 39.4 36.0 - 46.0 %   MCV 81.6 80.0 - 100.0 fL   MCH 25.1 (L) 26.0 - 34.0 pg   MCHC 30.7 30.0 - 36.0 g/dL   RDW 15.8 (H) 11.5 - 15.5 %   Platelets 412 (H) 150 - 400 K/uL   nRBC 0.0 0.0 - 0.2 %   Neutrophils Relative % 67 %   Neutro Abs 7.3 1.7 - 7.7 K/uL   Lymphocytes Relative 24 %   Lymphs Abs 2.5 0.7 - 4.0 K/uL   Monocytes Relative 6 %   Monocytes Absolute 0.6 0.1 - 1.0 K/uL   Eosinophils Relative 2 %    Eosinophils Absolute 0.2 0.0 - 0.5 K/uL   Basophils Relative 1 %   Basophils Absolute 0.1 0.0 - 0.1 K/uL   Immature Granulocytes 0 %   Abs Immature Granulocytes 0.03 0.00 - 0.07 K/uL    Comment: Performed at [facility], [address] [place], [place] [id number]  Basic metabolic panel     Status: Abnormal   Collection Time: 11/07/21  9:48 AM  Result Value Ref Range   Sodium 139 135 - 145 mmol/L   Potassium 4.0 3.5 - 5.1 mmol/L   Chloride 103 98 - 111 mmol/L   CO2 24 22 - 32 mmol/L   Glucose, Bld 138 (H) 70 - 99 mg/dL    Comment: Glucose reference range applies only to samples taken after fasting for at least 8 hours.   BUN 12 6 - 20 mg/dL   Creatinine, Ser 0.96 0.44 - 1.00 mg/dL   Calcium 9.3 8.9 - 10.3 mg/dL   GFR, Estimated >60 >60 mL/min    Comment: (NOTE) Calculated using the CKD-EPI Creatinine Equation (2021)    Anion gap 12 5 - 15    Comment: Performed at [facility], [address] [place], [place] [address]  Sedimentation rate     Status: Abnormal   Collection Time: 11/07/21  3:04 PM  Result Value Ref Range   Sed Rate 60 (H) 0 - 20 mm/hr    Comment: Performed at [facility], [address], [place], [place] [address]  . CT Soft Tissue Neck W Contrast  Result Date: 11/07/2021 CLINICAL DATA:  Swelling under chin with difficult swallowing. EXAM: CT NECK WITH CONTRAST TECHNIQUE: Multidetector CT imaging of the neck was performed using the standard protocol following the bolus administration of intravenous contrast. RADIATION DOSE REDUCTION: This exam was performed according to the departmental dose-optimization program which includes automated exposure control, adjustment of the mA and/or kV according to patient size and/or use of iterative reconstruction technique. CONTRAST:  181mL OMNIPAQUE IOHEXOL 300 MG/ML  SOLN COMPARISON:  None Available. FINDINGS: Pharynx and larynx: Normal. No mass or swelling. Salivary glands: There is fatty infiltration  of the bilateral submandibular and parotid glands. Thyroid: Normal. Lymph nodes: There are multiple rounded and enlarged submental lymph nodes measuring up to 1.1 cm in  short axis (series 3, image 78). There is also hypertrophy of the lingual tonsils. Vascular: Negative. Limited intracranial: Negative. Visualized orbits: Negative. Mastoids and visualized paranasal sinuses: Clear. Skeleton: No acute or aggressive process. Upper chest: Negative. Other: There is a linear soft tissue stranding that appears to extend inferiorly from the anterior belly of the digastric on the right (series 7, image 33) to the platysma. Soft tissue standing extends along the platysma and also appears to involve the submandibular space (series 3, image 78/series 7, image 35). There is also soft tissue stranding in the pre mandibular soft tissues along the chin (series 3, image 71). IMPRESSION: 1. Soft tissue stranding along the chin with extension into the submandibular space, as well as along the platysma. Findings are suspicious for submandibular space and likely floor of mouth cellulitis. Recommend ENT consultation. No drainable fluid collection. 2.Submental lymphadenopathy and tonsillar hypertrophy is favored to be reactive. Critical Value/emergent results were called by telephone at the time of interpretation on 11/07/2021 at 12:46 pm to provider [facility] , who verbally acknowledged these results. Electronically Signed   By: Marin Roberts M.D.   On: 11/07/2021 12:47  .   ASSESSMENT: Cervical lymphadenitis with mild cellulitis already improving with medical management.  She does have a small ulcer in the lower lip that corresponded to the onset of this and may have been viral in nature, causing the lymph node enlargement.  This would also be an entry point for oral bacteria to potentially contribute to mild cellulitis. PLAN: Would continue medical management.  She seems to be rapidly improving and could be discharged on p.o.  clindamycin as long as this continues.  I offered a scope exam to evaluate the throat but she was already doing so much better she declined.  We discussed that she might want to follow-up as an outpatient with her dentist just to make sure there is no underlying dental issue that could have precipitated this, given the location of the lymph nodes, though admittedly the oral ulcer may be the thing that triggered the lymphadenitis in the first place.   Riley Nearing, MD 11/07/2021 5:14 PM

## 2021-11-07 NOTE — ED Notes (Signed)
Per IV team- IV placed as charted after 3 attempts w/ ultrasound, pt hard stick.

## 2021-11-07 NOTE — Progress Notes (Signed)
PHARMACIST - PHYSICIAN COMMUNICATION  CONCERNING:  Enoxaparin (Lovenox) for DVT Prophylaxis    RECOMMENDATION: Patient was prescribed enoxaprin 40mg  q24 hours for VTE prophylaxis.   Filed Weights   11/07/21 0919  Weight: (!) 136.1 kg (300 lb 0.7 oz)    Body mass index is 48.43 kg/m.  Estimated Creatinine Clearance: 132.8 mL/min (by C-G formula based on SCr of 0.96 mg/dL).   Based on [place] patient is candidate for enoxaparin 0.5mg /kg TBW SQ every 24 hours based on BMI being >30.  DESCRIPTION: Pharmacy has adjusted enoxaparin dose per [facility] policy.  Patient is now receiving enoxaparin 0.5 mg/kg every 24 hours    Dallie Piles, PharmD Clinical Pharmacist  11/07/2021 2:07 PM

## 2021-11-07 NOTE — ED Provider Notes (Signed)
[facility] Provider Note    Event Date/Time   First MD Initiated Contact with Patient 11/07/21 [phone]     (approximate)   History   Chief Complaint Oral Swelling   HPI  ARDITH TEST is a 21 y.o. female with no significant past medical history who presents to the ED complaining of oral swelling.  Patient reports that she developed pain and swelling in the right side of her lower lip overnight last night.  This morning, she began to notice some swelling below her mandible with some tenderness in this area.  She denies any broken or infected teeth recently, has not had any fevers.  She does states she has been able to swallow since onset of symptoms, but reports feeling like "something is stuck" when she does so.  She has not had any difficulty breathing and denies any cough.  She denies any history of similar symptoms.     Physical Exam   Triage Vital Signs: ED Triage Vitals  Enc Vitals Group     BP 11/07/21 0921 (!) 155/84     Pulse Rate 11/07/21 0921 (!) 106     Resp 11/07/21 0921 16     Temp 11/07/21 0921 98.9 F (37.2 C)     Temp Source 11/07/21 0921 Oral     SpO2 11/07/21 0921 97 %     Weight 11/07/21 0919 (!) 300 lb 0.7 oz (136.1 kg)     Height 11/07/21 0919 5\' 6"  (1.676 m)     Head Circumference --      Peak Flow --      Pain Score 11/07/21 0919 0     Pain Loc --      Pain Edu? --      Excl. in [place]? --     Most recent vital signs: Vitals:   11/07/21 1309 11/07/21 1310  BP:    Pulse: 99   Resp:    Temp:  98.9 F (37.2 C)  SpO2: 100%     Constitutional: Alert and oriented. Eyes: Conjunctivae are normal. Head: Atraumatic. Nose: No congestion/rhinnorhea. Mouth/Throat: Mucous membranes are moist.  Ulceration to anterior portion of right lower lip with mild edema, no erythema or drainage noted.  Dentition intact without, erythema, edema, or fluctuance.  Significant loose tissue underneath mandible due to patient's body habitus, no  obvious edema or erythema noted but submandibular compartments firm with mild tenderness. Cardiovascular: Normal rate, regular rhythm. Grossly normal heart sounds.  2+ radial pulses bilaterally. Respiratory: Normal respiratory effort.  No retractions. Lungs CTAB. Gastrointestinal: Soft and nontender. No distention. Musculoskeletal: No lower extremity tenderness nor edema.  Neurologic:  Normal speech and language. No gross focal neurologic deficits are appreciated.    ED Results / Procedures / Treatments   Labs (all labs ordered are listed, but only abnormal results are displayed) Labs Reviewed  CBC WITH DIFFERENTIAL/PLATELET - Abnormal; Notable for the following components:      Result Value   WBC 10.7 (*)    MCH 25.1 (*)    RDW 15.8 (*)    Platelets 412 (*)    All other components within normal limits  BASIC METABOLIC PANEL - Abnormal; Notable for the following components:   Glucose, Bld 138 (*)    All other components within normal limits    RADIOLOGY CT soft tissue neck reviewed and interpreted by me with inflammatory changes in the submandibular compartment as well as tracking along the platysma, no focal fluid collection noted.  PROCEDURES:  Critical Care performed: No  Procedures   MEDICATIONS ORDERED IN ED: Medications  cefTRIAXone (ROCEPHIN) 2 g in sodium chloride 0.9 % 100 mL IVPB (2 g Intravenous New Bag/Given 11/07/21 1323)    And  metroNIDAZOLE (FLAGYL) IVPB 500 mg (has no administration in time range)  iohexol (OMNIPAQUE) 300 MG/ML solution 100 mL (100 mLs Intravenous Contrast Given 11/07/21 1152)  dexamethasone (DECADRON) injection 10 mg (10 mg Intravenous Given 11/07/21 1323)     IMPRESSION / MDM / ASSESSMENT AND PLAN / ED COURSE  I reviewed the triage vital signs and the nursing notes.                              21 y.o. female with no significant past medical history who presents to the ED complaining of right lower lip pain and swelling developing  overnight, now extending into her submandibular area.  Patient's presentation is most consistent with acute presentation with potential threat to life or bodily function.  Differential diagnosis includes, but is not limited to, aphthous ulceration, Ludwick's angina, dental abscess, sialolithiasis, sialoadenitis, globus sensation.  Patient nontoxic-appearing and in no acute distress, vital signs remarkable for mild tachycardia but otherwise reassuring.  She does not have any respiratory symptoms and is tolerating her oral secretions without difficulty.  Her neck is difficult to assess due to her body habitus, however there seems to be some mild submandibular edema with associated tenderness.  Lip swelling and tenderness seems most consistent with aphthous ulceration, but we will further assess with CT imaging to ensure no pocket of deeper infection.  No obvious dental infection noted on exam.  Patient declines pain medication at this time.  CT scan concerning for cellulitis tracking from the chin into her submandibular space and along her platysma.  No focal abscess noted, although findings concerning for developing Ludwick's angina, clinically the patient continues to appear well with no difficulty tolerating her oral secretions.  Level of edema on exam does not appear consistent with Ludewig's angina.  Findings reviewed with Dr. Richardson Landry of ENT, no indication for surgical intervention at this time.  Airway intact currently but given worsening infection could affect her airway, case discussed with hospitalist for admission.  We will treat with Rocephin and Flagyl given her penicillin allergy, will also treat with IV Decadron.      FINAL CLINICAL IMPRESSION(S) / ED DIAGNOSES   Final diagnoses:  Cellulitis of submandibular region     Rx / DC Orders   ED Discharge Orders     None        Note:  This document was prepared using Dragon voice recognition software and may include unintentional  dictation errors.   Blake Divine, MD 11/07/21 [phone]

## 2021-11-07 NOTE — H&P (Signed)
[facility] History and Physical   Patient: Michele Velazquez NWG:[id number] DOB: [birth year] DOA: 11/07/2021 PCP: Pcp, No  DOS: the patient was seen and examined on 11/07/2021  Patient coming from: Home  Chief Complaint: Right lower lip and right lower chin pain and swelling   HPI: Michele Velazquez is a 21 y.o. female with past medical history significant for morbid obesity now presents to emergency department with right lower lip and right lower chin pain and swelling.  Patient states that yesterday she started noticing swelling of her right lower lip and some suspicion for small ulcer over the buccal mucosa.  This morning her right lower lip swelling persisted and she started experiencing pain under her chin.  Patient also had minimal difficulty to eat and swallow.  Her brother told her to go to ER for further evaluation.  Patient denies any tooth pain, recent dental treatment, fever, chills, nausea, vomiting, headache, chest pain, cough, abdominal pain, constipation/diarrhea or urinary complaints.  He denies smoking tobacco, drinking alcohol or using illicit drug.    ER course:  Vitals: Temperature 98.8 F, heart rate of 106, blood pressure 155/84, respiratory rate of 16 and oxygen saturation 97% on room air.  Blood work: Mild leukocytosis with WBC count 10,700.  Blood glucose mildly elevated at 138.  The rest of the labs unremarkable.  Imaging: CT soft tissue neck with contrast showed stranding along the chin with extension into the submandibular space, as well as along the platysma. Findings are suspicious for submandibular space and likely floor of mouth cellulitis. No drainable fluid collection. Submental lymphadenopathy and tonsillar hypertrophy is favored to be reactive.  Treatment received: ED provider discussed with Dr. Richardson Landry, ENT surgeon who recommended no surgical intervention and to treated with IV antibiotics.  Patient was treated in the emergency department with IV ceftriaxone  and IV Flagyl and she is being admitted to hospitalist service for further management.  Review of Systems: As mentioned in the history of present illness. All other systems reviewed and are negative.  History reviewed. No pertinent past medical history. History reviewed. No pertinent surgical history. Social History:  reports that she has never smoked. She has never used smokeless tobacco. She reports that she does not drink alcohol. No history on file for drug use.  Allergies  Allergen Reactions   Penicillins Anaphylaxis   Sulfa Antibiotics Hives   Bactrim [Sulfamethoxazole-Trimethoprim] Rash    Family history reviewed and not pertinent History reviewed. No pertinent family history.  Prior to Admission medications   Medication Sig Start Date End Date Taking? Authorizing Provider  acyclovir (ZOVIRAX) 800 MG tablet Take 1 tablet (800 mg total) by mouth 3 (three) times daily. Patient not taking: Reported on 04/12/2021 01/30/19   Versie Starks, PA-C  predniSONE (STERAPRED UNI-PAK 21 TAB) 10 MG (21) TBPK tablet Take 6 tablets the first day, take 5 tablets the second day, take 4 tablets the third day, take 3 tablets the fourth day, take 2 tablets the fifth day, take 1 tablet the sixth day. Patient not taking: Reported on 04/12/2021 11/08/20   Lannie Fields, PA-C    Physical Exam: Vitals:   11/07/21 1330 11/07/21 1400 11/07/21 1430 11/07/21 1504  BP: (!) 169/88 (!) 148/83  137/72  Pulse: (!) 109 98 99 (!) 104  Resp: 18 16 17 16   Temp:    98.9 F (37.2 C)  TempSrc:      SpO2: 98% 98% 99% 99%  Weight:      Height:  GENERAL:  21 y.o.-year-old patient lying in the bed with no acute distress.  EYES: Pupils equal, round, reactive to light and accommodation. No scleral icterus. Extraocular muscles intact.  HEENT: Minimal swelling of right lower lip noted with tenderness.  Head atraumatic, normocephalic. Oropharynx and nasopharynx clear.  NECK: Minimal swelling over the night  submandibular region with minimal tenderness.  No JVD.  No thyroid enlargement.    LUNGS: Normal breath sounds bilaterally, no wheezing, rales,rhonchi or crepitation. No use of accessory muscles of respiration.  CARDIOVASCULAR: Regular rate and rhythm, S1, S2 normal. No murmurs, rubs, or gallops.  ABDOMEN: Soft, nondistended, nontender. Bowel sounds present. No organomegaly or mass.  EXTREMITIES: No pedal edema, cyanosis, or clubbing.  NEUROLOGIC: Cranial nerves II through XII are intact. Muscle strength 5/5 in all extremities. Sensation intact. Gait not checked.  PSYCHIATRIC: The patient is alert and oriented x 3.  Normal affect and good eye contact. SKIN: No obvious rash, lesion, or ulcer.   Data Reviewed: I have personally reviewed and interpreted labs, imaging as discussed below.  CBC: Recent Labs  Lab 11/07/21 0948  WBC 10.7*  NEUTROABS 7.3  HGB 12.1  HCT 39.4  MCV 81.6  PLT 412*   Basic Metabolic Panel: Recent Labs  Lab 11/07/21 0948  NA 139  K 4.0  CL 103  CO2 24  GLUCOSE 138*  BUN 12  CREATININE 0.96  CALCIUM 9.3   GFR: Estimated Creatinine Clearance: 132.8 mL/min (by C-G formula based on SCr of 0.96 mg/dL). Liver Function Tests: No results for input(s): "AST", "ALT", "ALKPHOS", "BILITOT", "PROT", "ALBUMIN" in the last 168 hours. No results for input(s): "LIPASE", "AMYLASE" in the last 168 hours. No results for input(s): "AMMONIA" in the last 168 hours. Coagulation Profile: No results for input(s): "INR", "PROTIME" in the last 168 hours. Cardiac Enzymes: No results for input(s): "CKTOTAL", "CKMB", "CKMBINDEX", "TROPONINI" in the last 168 hours. BNP (last 3 results) No results for input(s): "PROBNP" in the last 8760 hours. HbA1C: No results for input(s): "HGBA1C" in the last 72 hours. CBG: No results for input(s): "GLUCAP" in the last 168 hours. Lipid Profile: No results for input(s): "CHOL", "HDL", "LDLCALC", "TRIG", "CHOLHDL", "LDLDIRECT" in the last 72  hours. Thyroid Function Tests: No results for input(s): "TSH", "T4TOTAL", "FREET4", "T3FREE", "THYROIDAB" in the last 72 hours. Anemia Panel: No results for input(s): "VITAMINB12", "FOLATE", "FERRITIN", "TIBC", "IRON", "RETICCTPCT" in the last 72 hours. Urine analysis:    Component Value Date/Time   COLORURINE YELLOW (A) 11/08/2020 2152   APPEARANCEUR HAZY (A) 11/08/2020 2152   LABSPEC 1.015 11/08/2020 2152   PHURINE 5.0 11/08/2020 2152   GLUCOSEU NEGATIVE 11/08/2020 2152   HGBUR NEGATIVE 11/08/2020 2152   BILIRUBINUR NEGATIVE 11/08/2020 2152   KETONESUR NEGATIVE 11/08/2020 2152   PROTEINUR >=300 (A) 11/08/2020 2152   NITRITE NEGATIVE 11/08/2020 2152   LEUKOCYTESUR NEGATIVE 11/08/2020 2152    Radiological Exams on Admission: CT Soft Tissue Neck W Contrast  Result Date: 11/07/2021 CLINICAL DATA:  Swelling under chin with difficult swallowing. EXAM: CT NECK WITH CONTRAST TECHNIQUE: Multidetector CT imaging of the neck was performed using the standard protocol following the bolus administration of intravenous contrast. RADIATION DOSE REDUCTION: This exam was performed according to the departmental dose-optimization program which includes automated exposure control, adjustment of the mA and/or kV according to patient size and/or use of iterative reconstruction technique. CONTRAST:  OMNIPAQUE IOHEXOL 300 MG/ML  SOLN COMPARISON:  None Available. FINDINGS: Pharynx and larynx: Normal. No mass or swelling. Salivary glands:  There is fatty infiltration of the bilateral submandibular and parotid glands. Thyroid: Normal. Lymph nodes: There are multiple rounded and enlarged submental lymph nodes measuring up to 1.1 cm in short axis (series 3, image 78). There is also hypertrophy of the lingual tonsils. Vascular: Negative. Limited intracranial: Negative. Visualized orbits: Negative. Mastoids and visualized paranasal sinuses: Clear. Skeleton: No acute or aggressive process. Upper chest: Negative.  Other: There is a linear soft tissue stranding that appears to extend inferiorly from the anterior belly of the digastric on the right (series 7, image 33) to the platysma. Soft tissue standing extends along the platysma and also appears to involve the submandibular space (series 3, image 78/series 7, image 35). There is also soft tissue stranding in the pre mandibular soft tissues along the chin (series 3, image 71). IMPRESSION: 1. Soft tissue stranding along the chin with extension into the submandibular space, as well as along the platysma. Findings are suspicious for submandibular space and likely floor of mouth cellulitis. Recommend ENT consultation. No drainable fluid collection. 2.Submental lymphadenopathy and tonsillar hypertrophy is favored to be reactive. Critical Value/emergent results were called by telephone at the time of interpretation on 11/07/2021 at 12:46 pm to provider [facility] , who verbally acknowledged these results. Electronically Signed   By: Lorenza Cambridge M.D.   On: 11/07/2021 12:47      I reviewed all nursing notes, pharmacy notes, vitals, pertinent old records.  Assessment/Plan  Principal Problem:   Cellulitis of submandibular region Active Problems:   Obesity, Class III, BMI 40-49.9 (morbid obesity) (HCC)   Sinus tachycardia   Elevated blood pressure reading   1. Cellulitis of submandibular region -Admit patient to medical floor. - CT soft tissue neck with contrast suspicious for submandibular space and likely floor of mouth cellulitis. No drainable fluid collection.  - ED provider has discussed with Dr. Willeen Cass, ENT surgeon on-call who has recommended no surgical intervention as there is no evidence of abscess.  IV antibiotic therapy. -I will treat patient with IV ceftriaxone and IV Flagyl for broader coverage. -Clear liquid diet and slowly advance as tolerated. -If patient fails to respond or her condition worsens, consider consulting ENT.  2.  Sinus  tachycardia - Secondary to underlying infection.  I will treat patient with IV fluid along with IV antibiotics as mentioned above.  Closely monitor vitals.  3.  Elevated blood pressure -Patient noted to have mildly elevated blood pressure in the ER.  Patient denies any history of essential hypertension.  She is morbidly obese.   - We will continue to monitor closely.  Counseled patient on diet and exercise to lose weight.  4.  Morbid obesity -Patient is morbidly obese with BMI of 48.  Her obesity can complicate her underlying medical issue.  Counseled patient on diet and exercise to lose weight.    Nutrition: Clear liquid diet DVT Prophylaxis: Subcutaneous Lovenox  Advance goals of care discussion:   Code Status: Full Code   Consults: ED provider has discussed with Dr. Willeen Cass, ENT surgeon on-call as mentioned above.  Family Communication: No family was present at bedside, at the time of interview.   Opportunity was given to ask question and all questions were answered satisfactorily.   Disposition:  From: Home Likely will need Home on discharge.   Author: Dwain Sarna, MD [facility] 11/07/2021 5:20 PM   To reach On-call, see care teams to locate the attending and reach out to them via [email or web address]. If 7PM-7AM, please contact night-coverage If  you still have difficulty reaching the attending provider, please page the [facility] (Director on Call) for [facility] on amion for assistance.

## 2021-11-08 DIAGNOSIS — K122 Cellulitis and abscess of mouth: Secondary | ICD-10-CM | POA: Diagnosis not present

## 2021-11-08 LAB — BASIC METABOLIC PANEL
Anion gap: 10 (ref 5–15)
BUN: 8 mg/dL (ref 6–20)
CO2: 23 mmol/L (ref 22–32)
Calcium: 9.5 mg/dL (ref 8.9–10.3)
Chloride: 107 mmol/L (ref 98–111)
Creatinine, Ser: 0.8 mg/dL (ref 0.44–1.00)
GFR, Estimated: >60 mL/min (ref >60)
Glucose, Bld: 216 mg/dL — ABNORMAL HIGH (ref 70–99)
Potassium: 4 mmol/L (ref 3.5–5.1)
Sodium: 140 mmol/L (ref 135–145)

## 2021-11-08 MED ORDER — DEXAMETHASONE 4 MG PO TABS: 4.0000 mg | ORAL_TABLET | Freq: Two times a day (BID) | ORAL | 0 refills | Status: AC

## 2021-11-08 MED ORDER — INFLUENZA VAC SPLIT QUAD 0.5 ML IM SUSY
0.5000 mL | PREFILLED_SYRINGE | INTRAMUSCULAR | Status: DC
  Filled 2021-11-08: qty 0.5

## 2021-11-08 MED ORDER — CLINDAMYCIN HCL 300 MG PO CAPS: 300.0000 mg | ORAL_CAPSULE | Freq: Three times a day (TID) | ORAL | 0 refills | Status: AC

## 2021-11-08 MED ORDER — INFLUENZA VAC SPLIT QUAD 0.5 ML IM SUSY
0.5000 mL | PREFILLED_SYRINGE | Freq: Once | INTRAMUSCULAR | Status: AC
  Administered 2021-11-08: 0.5 mL via INTRAMUSCULAR
  Filled 2021-11-08: qty 0.5

## 2021-11-08 MED ORDER — DEXAMETHASONE SODIUM PHOSPHATE 10 MG/ML IJ SOLN
10.0000 mg | Freq: Once | INTRAMUSCULAR | Status: AC
  Administered 2021-11-08: 10 mg via INTRAVENOUS
  Filled 2021-11-08: qty 1

## 2021-11-08 NOTE — Discharge Summary (Signed)
Physician Discharge Summary  Michele Velazquez:[id number] DOB: 2000-03-05 DOA: 11/07/2021  PCP: Merryl Hacker, No  Admit date: 11/07/2021 Discharge date: 11/08/2021  Admitted From: Home Disposition:  Home  Recommendations for Outpatient Follow-up:  Follow up with PCP in 1-2 weeks Follow up with ENT as needed, or if symptoms worsen  Home Health:No  Equipment/Devices:None   Discharge Condition:Stable  CODE STATUS:FULL  Diet recommendation: Soft  Brief/Interim Summary: 21 y.o. female with past medical history significant for morbid obesity now presents to emergency department with right lower lip and right lower chin pain and swelling.  Patient states that yesterday she started noticing swelling of her right lower lip and some suspicion for small ulcer over the buccal mucosa.  This morning her right lower lip swelling persisted and she started experiencing pain under her chin.  Patient also had minimal difficulty to eat and swallow.  Her brother told her to go to ER for further evaluation.  Patient denies any tooth pain, recent dental treatment, fever, chills, nausea, vomiting, headache, chest pain, cough, abdominal pain, constipation/diarrhea or urinary complaints.  He denies smoking tobacco, drinking alcohol or using illicit drug.      ENT consulted by a ER physician.  No surgical intervention recommended.  Patient improved quickly during course of admission.  Received 1 dose of IV Decadron on day of admission, received a second dose on day of discharge.  Hemodynamically stable.  Downtrending white count.  No fevers.  Tolerating p.o. intake.  Stable for discharge home at this time.  Recommend continuation of soft diet.  10-day course of clindamycin prescribed.  5-day course of p.o. Decadron prescribed.  Follow-up outpatient PCP, ENT if necessary.  Consider follow-up with dentistry as well.   Discharge Diagnoses:  Principal Problem:   Cellulitis of submandibular region Active Problems:   Obesity,  Class III, BMI 40-49.9 (morbid obesity) (HCC)   Sinus tachycardia   Elevated blood pressure reading  Submandibular cellulitis CT soft tissue neck suspicious for submandibular cellulitis and extension into the floor of the mouth.  No drainable fluid collection.  ENT evaluated.  No indication for surgical intervention.  Patient was on ceftriaxone and Flagyl inpatient.  Transition to p.o. clindamycin at time of discharge.  Soft diet as tolerated.  Short course of p.o. Decadron will be prescribed.  Discharge Instructions  Discharge Instructions     Diet - low sodium heart healthy   Complete by: As directed    Increase activity slowly   Complete by: As directed       Allergies as of 11/08/2021       Reactions   Penicillins Anaphylaxis   Sulfa Antibiotics Hives   Bactrim [sulfamethoxazole-trimethoprim] Rash        Medication List     STOP taking these medications    acyclovir 800 MG tablet Commonly known as: ZOVIRAX   predniSONE 10 MG (21) Tbpk tablet Commonly known as: STERAPRED UNI-PAK 21 TAB       TAKE these medications    clindamycin 300 MG capsule Commonly known as: CLEOCIN Take 1 capsule (300 mg total) by mouth 3 (three) times daily for 10 days.   dexamethasone 4 MG tablet Commonly known as: Decadron Take 1 tablet (4 mg total) by mouth 2 (two) times daily for 5 days.        Follow-up Information     Clyde Canterbury, MD Follow up.   Specialty: Otolaryngology Why: As needed, If symptoms worsen Contact information: [place]  [id number] [phone]                Allergies  Allergen Reactions   Penicillins Anaphylaxis   Sulfa Antibiotics Hives   Bactrim [Sulfamethoxazole-Trimethoprim] Rash    Consultations: ENT   Procedures/Studies: CT Soft Tissue Neck W Contrast  Result Date: 11/07/2021 CLINICAL DATA:  Swelling under chin with difficult swallowing. EXAM: CT NECK WITH CONTRAST TECHNIQUE:  Multidetector CT imaging of the neck was performed using the standard protocol following the bolus administration of intravenous contrast. RADIATION DOSE REDUCTION: This exam was performed according to the departmental dose-optimization program which includes automated exposure control, adjustment of the mA and/or kV according to patient size and/or use of iterative reconstruction technique. CONTRAST:  OMNIPAQUE IOHEXOL 300 MG/ML  SOLN COMPARISON:  None Available. FINDINGS: Pharynx and larynx: Normal. No mass or swelling. Salivary glands: There is fatty infiltration of the bilateral submandibular and parotid glands. Thyroid: Normal. Lymph nodes: There are multiple rounded and enlarged submental lymph nodes measuring up to 1.1 cm in short axis (series 3, image 78). There is also hypertrophy of the lingual tonsils. Vascular: Negative. Limited intracranial: Negative. Visualized orbits: Negative. Mastoids and visualized paranasal sinuses: Clear. Skeleton: No acute or aggressive process. Upper chest: Negative. Other: There is a linear soft tissue stranding that appears to extend inferiorly from the anterior belly of the digastric on the right (series 7, image 33) to the platysma. Soft tissue standing extends along the platysma and also appears to involve the submandibular space (series 3, image 78/series 7, image 35). There is also soft tissue stranding in the pre mandibular soft tissues along the chin (series 3, image 71). IMPRESSION: 1. Soft tissue stranding along the chin with extension into the submandibular space, as well as along the platysma. Findings are suspicious for submandibular space and likely floor of mouth cellulitis. Recommend ENT consultation. No drainable fluid collection. 2.Submental lymphadenopathy and tonsillar hypertrophy is favored to be reactive. Critical Value/emergent results were called by telephone at the time of interpretation on 11/07/2021 at 12:46 pm to provider [facility] , who  verbally acknowledged these results. Electronically Signed   By: Lorenza Cambridge M.D.   On: 11/07/2021 12:47      Subjective: Seen and examined on the day of discharge.  Stable no distress.  Tolerating p.o. intake.  Stable for discharge home.  Discharge Exam: Vitals:   11/08/21 0557 11/08/21 0815  BP: 125/70 122/66  Pulse: 94 76  Resp: 18 (!) 22  Temp: 98 F (36.7 C) 98.1 F (36.7 C)  SpO2: 100% 99%   Vitals:   11/07/21 1504 11/07/21 2008 11/08/21 0557 11/08/21 0815  BP: 137/72 (!) 140/83 125/70 122/66  Pulse: (!) 104 95 94 76  Resp: 16 16 18  (!) 22  Temp: 98.9 F (37.2 C) 97.8 F (36.6 C) 98 F (36.7 C) 98.1 F (36.7 C)  TempSrc:  Oral Oral   SpO2: 99% 100% 100% 99%  Weight:      Height:        General: Pt is alert, awake, not in acute distress Cardiovascular: RRR, S1/S2 +, no rubs, no gallops Respiratory: CTA bilaterally, no wheezing, no rhonchi Abdominal: Soft, NT, ND, bowel sounds + Extremities: no edema, no cyanosis    The results of significant diagnostics from this hospitalization (including imaging, microbiology, ancillary and laboratory) are listed below for reference.     Microbiology: No results found for this or any previous visit (from the past 240 hour(s)).   Labs: BNP (last  3 results) No results for input(s): "BNP" in the last 8760 hours. Basic Metabolic Panel: Recent Labs  Lab 11/07/21 0948 11/08/21 0646  NA 139 140  K 4.0 4.0  CL 103 107  CO2 24 23  GLUCOSE 138* 216*  BUN 12 8  CREATININE 0.96 0.80  CALCIUM 9.3 9.5   Liver Function Tests: No results for input(s): "AST", "ALT", "ALKPHOS", "BILITOT", "PROT", "ALBUMIN" in the last 168 hours. No results for input(s): "LIPASE", "AMYLASE" in the last 168 hours. No results for input(s): "AMMONIA" in the last 168 hours. CBC: Recent Labs  Lab 11/07/21 0948  WBC 10.7*  NEUTROABS 7.3  HGB 12.1  HCT 39.4  MCV 81.6  PLT 412*   Cardiac Enzymes: No results for input(s): "CKTOTAL",  "CKMB", "CKMBINDEX", "TROPONINI" in the last 168 hours. BNP: Invalid input(s): "POCBNP" CBG: No results for input(s): "GLUCAP" in the last 168 hours. D-Dimer No results for input(s): "DDIMER" in the last 72 hours. Hgb A1c No results for input(s): "HGBA1C" in the last 72 hours. Lipid Profile No results for input(s): "CHOL", "HDL", "LDLCALC", "TRIG", "CHOLHDL", "LDLDIRECT" in the last 72 hours. Thyroid function studies No results for input(s): "TSH", "T4TOTAL", "T3FREE", "THYROIDAB" in the last 72 hours.  Invalid input(s): "FREET3" Anemia work up No results for input(s): "VITAMINB12", "FOLATE", "FERRITIN", "TIBC", "IRON", "RETICCTPCT" in the last 72 hours. Urinalysis    Component Value Date/Time   COLORURINE YELLOW (A) 11/08/2020 2152   APPEARANCEUR HAZY (A) 11/08/2020 2152   LABSPEC 1.015 11/08/2020 2152   PHURINE 5.0 11/08/2020 2152   GLUCOSEU NEGATIVE 11/08/2020 2152   HGBUR NEGATIVE 11/08/2020 2152   BILIRUBINUR NEGATIVE 11/08/2020 2152   KETONESUR NEGATIVE 11/08/2020 2152   PROTEINUR >=300 (A) 11/08/2020 2152   NITRITE NEGATIVE 11/08/2020 2152   LEUKOCYTESUR NEGATIVE 11/08/2020 2152   Sepsis Labs Recent Labs  Lab 11/07/21 0948  WBC 10.7*   Microbiology No results found for this or any previous visit (from the past 240 hour(s)).   Time coordinating discharge: Over 30 minutes  SIGNED:   Tresa Moore, MD  [facility] 11/08/2021, 1:29 PM Pager   If 7PM-7AM, please contact night-coverage

## 2021-11-08 NOTE — Progress Notes (Signed)
Brief update note.  Full note to follow.  21 year old female presents with submandibular cellulitis.  Seen in consultation by ENT.  No surgical intervention recommended.  Patient clinically improving.  Will give 1 more dose of Decadron, advance diet to soft.  Patient remained stable anticipate discharge this afternoon.  Will discharge on p.o. clindamycin.  Ralene Muskrat MD

## 2021-11-16 DIAGNOSIS — Z419 Encounter for procedure for purposes other than remedying health state, unspecified: Secondary | ICD-10-CM | POA: Diagnosis not present

## 2021-12-17 DIAGNOSIS — Z419 Encounter for procedure for purposes other than remedying health state, unspecified: Secondary | ICD-10-CM | POA: Diagnosis not present

## 2022-01-16 DIAGNOSIS — Z419 Encounter for procedure for purposes other than remedying health state, unspecified: Secondary | ICD-10-CM | POA: Diagnosis not present

## 2022-02-16 DIAGNOSIS — Z419 Encounter for procedure for purposes other than remedying health state, unspecified: Secondary | ICD-10-CM | POA: Diagnosis not present

## 2022-03-19 DIAGNOSIS — Z419 Encounter for procedure for purposes other than remedying health state, unspecified: Secondary | ICD-10-CM | POA: Diagnosis not present

## 2022-04-17 DIAGNOSIS — Z419 Encounter for procedure for purposes other than remedying health state, unspecified: Secondary | ICD-10-CM | POA: Diagnosis not present

## 2022-05-18 DIAGNOSIS — Z419 Encounter for procedure for purposes other than remedying health state, unspecified: Secondary | ICD-10-CM | POA: Diagnosis not present

## 2022-06-17 DIAGNOSIS — Z419 Encounter for procedure for purposes other than remedying health state, unspecified: Secondary | ICD-10-CM | POA: Diagnosis not present

## 2022-07-18 DIAGNOSIS — Z419 Encounter for procedure for purposes other than remedying health state, unspecified: Secondary | ICD-10-CM | POA: Diagnosis not present

## 2022-08-17 DIAGNOSIS — Z419 Encounter for procedure for purposes other than remedying health state, unspecified: Secondary | ICD-10-CM | POA: Diagnosis not present

## 2022-09-17 DIAGNOSIS — Z419 Encounter for procedure for purposes other than remedying health state, unspecified: Secondary | ICD-10-CM | POA: Diagnosis not present

## 2022-10-18 DIAGNOSIS — Z419 Encounter for procedure for purposes other than remedying health state, unspecified: Secondary | ICD-10-CM | POA: Diagnosis not present

## 2022-11-17 DIAGNOSIS — Z419 Encounter for procedure for purposes other than remedying health state, unspecified: Secondary | ICD-10-CM | POA: Diagnosis not present

## 2022-12-18 DIAGNOSIS — Z419 Encounter for procedure for purposes other than remedying health state, unspecified: Secondary | ICD-10-CM | POA: Diagnosis not present

## 2023-01-17 DIAGNOSIS — Z419 Encounter for procedure for purposes other than remedying health state, unspecified: Secondary | ICD-10-CM | POA: Diagnosis not present

## 2023-01-28 IMAGING — CR DG LUMBAR SPINE 2-3V
3 series · 3 of 3 positions shown · non-contrast
Comparison: None.

CLINICAL DATA: Low back pain, no known injury, initial encounter

EXAM:
LUMBAR SPINE - 3 VIEW

[l-spine ap]
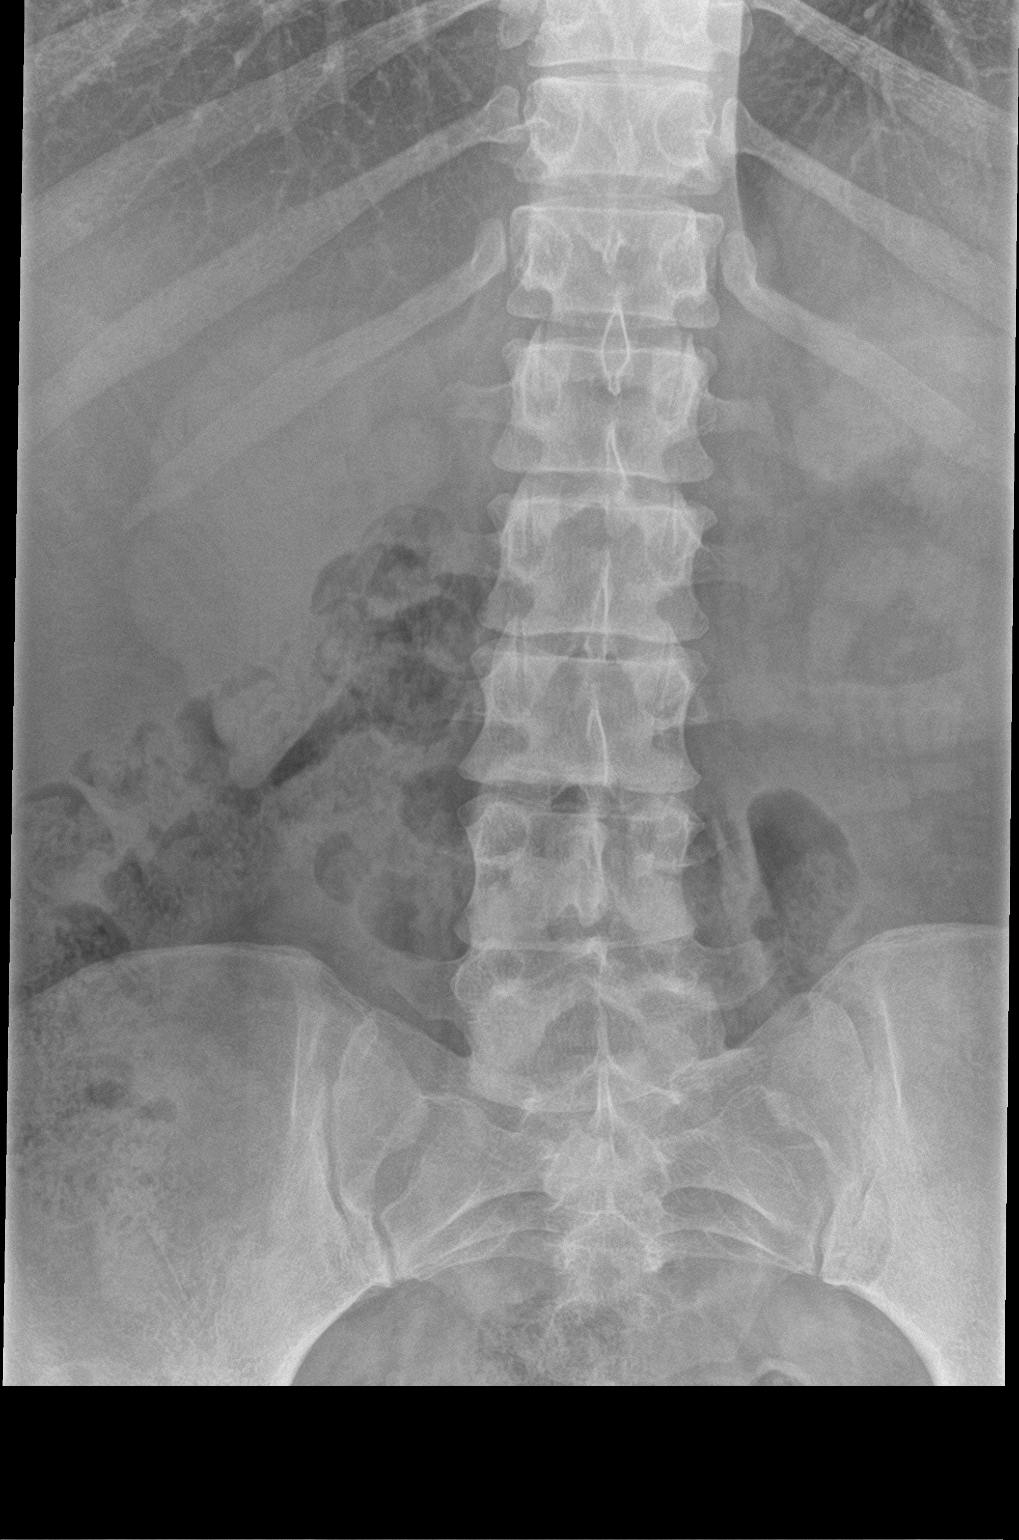

[l-spine lat]
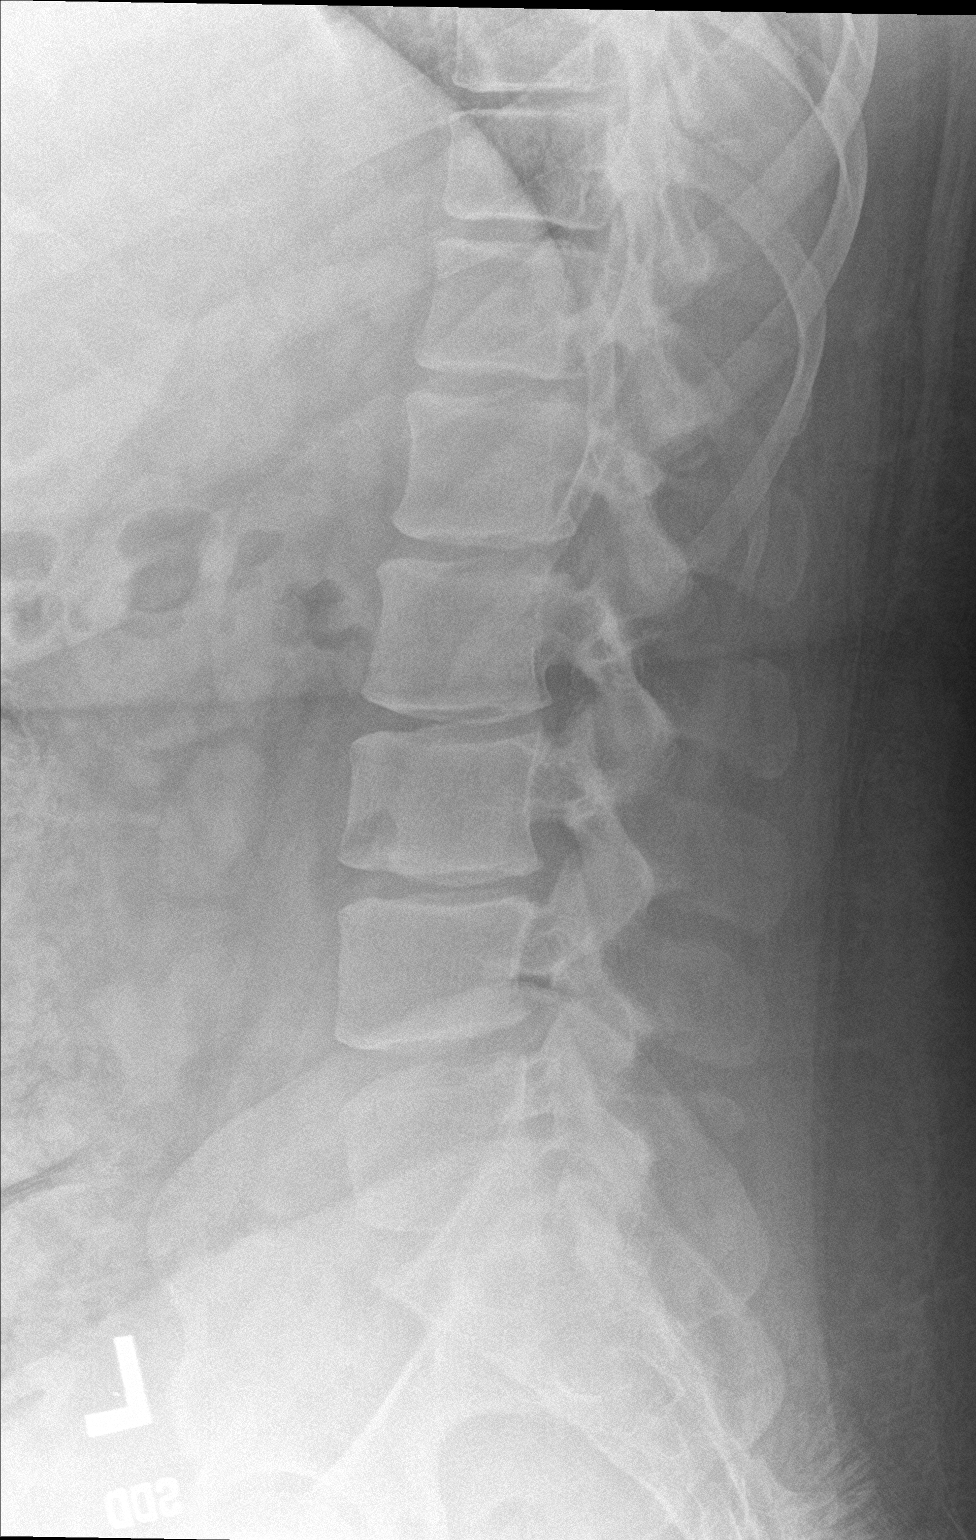

[l-spine spot]
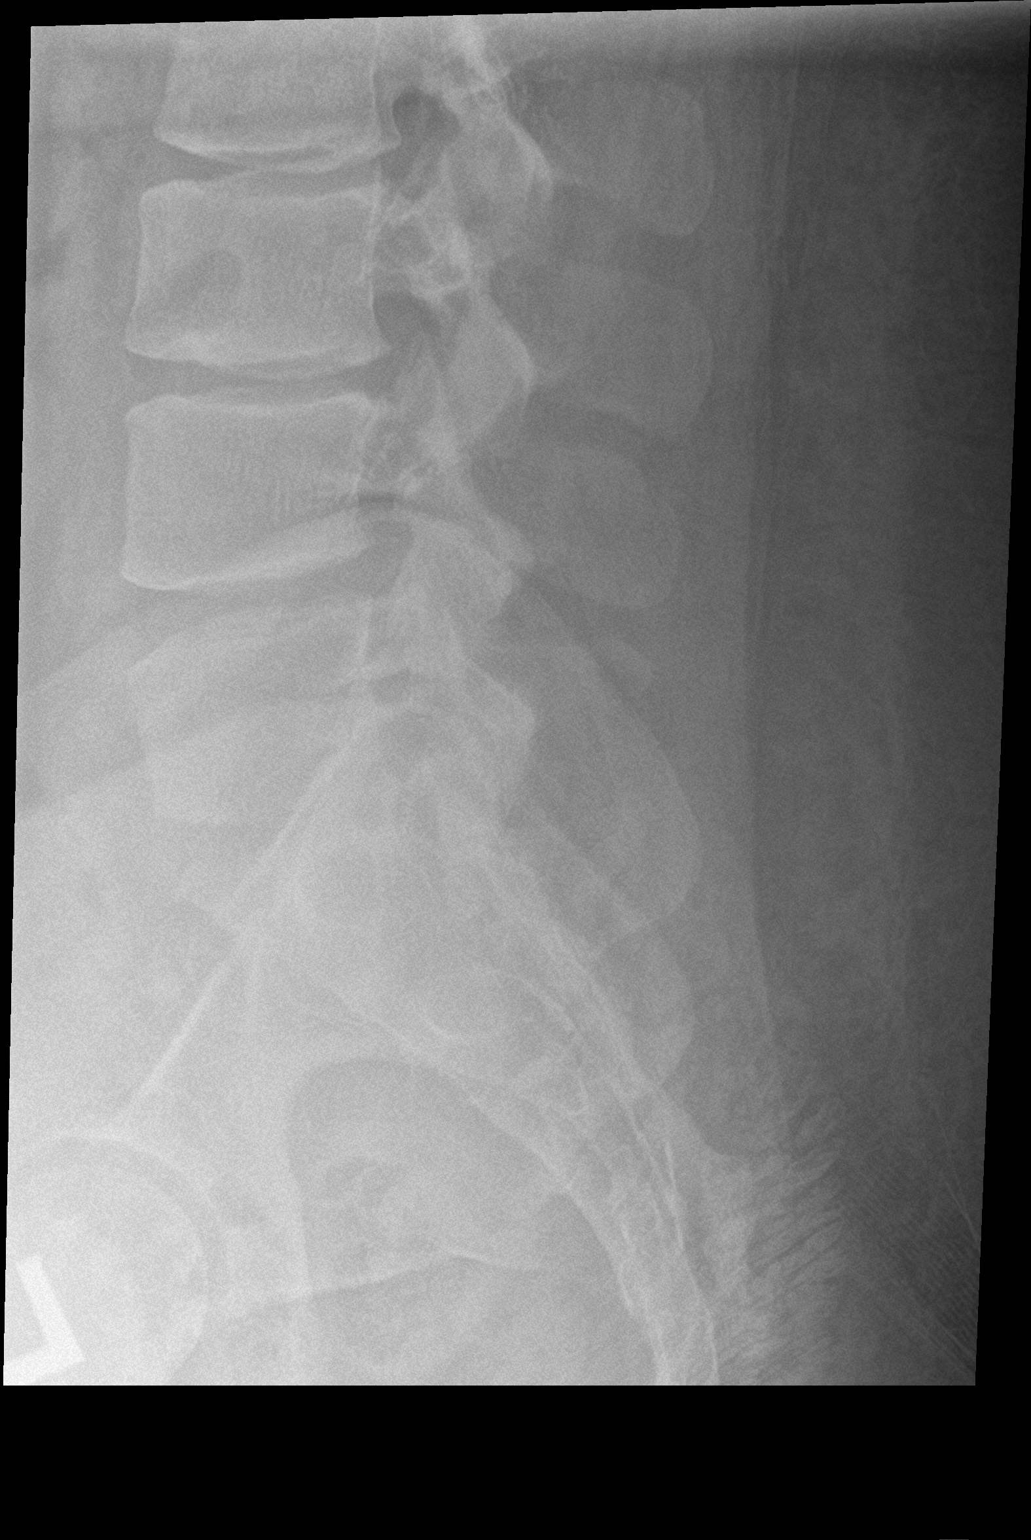

[3 of 3 positions shown; findings below may reference images not displayed]

FINDINGS: Five lumbar type vertebral bodies are well visualized. Vertebral
body height is well maintained. No anterolisthesis is noted. No soft
tissue abnormality is noted.
IMPRESSION: No acute abnormality noted.

## 2023-02-17 DIAGNOSIS — Z419 Encounter for procedure for purposes other than remedying health state, unspecified: Secondary | ICD-10-CM | POA: Diagnosis not present

## 2023-03-20 DIAGNOSIS — Z419 Encounter for procedure for purposes other than remedying health state, unspecified: Secondary | ICD-10-CM | POA: Diagnosis not present

## 2023-04-17 DIAGNOSIS — Z419 Encounter for procedure for purposes other than remedying health state, unspecified: Secondary | ICD-10-CM | POA: Diagnosis not present

## 2023-05-29 DIAGNOSIS — Z419 Encounter for procedure for purposes other than remedying health state, unspecified: Secondary | ICD-10-CM | POA: Diagnosis not present

## 2023-06-28 DIAGNOSIS — Z419 Encounter for procedure for purposes other than remedying health state, unspecified: Secondary | ICD-10-CM | POA: Diagnosis not present

## 2023-07-29 DIAGNOSIS — Z419 Encounter for procedure for purposes other than remedying health state, unspecified: Secondary | ICD-10-CM | POA: Diagnosis not present

## 2023-08-28 DIAGNOSIS — Z419 Encounter for procedure for purposes other than remedying health state, unspecified: Secondary | ICD-10-CM | POA: Diagnosis not present

## 2023-09-22 ENCOUNTER — Emergency Department

## 2023-09-22 ENCOUNTER — Emergency Department
Admission: EM | Admit: 2023-09-22 | Discharge: 2023-09-22 | Disposition: A | Discharge status: Home / Self Care | Attending: Emergency Medicine | Admitting: Emergency Medicine

## 2023-09-22 ENCOUNTER — Other Ambulatory Visit: Payer: Self-pay

## 2023-09-22 DIAGNOSIS — M21371 Foot drop, right foot: Secondary | ICD-10-CM

## 2023-09-22 DIAGNOSIS — M5126 Other intervertebral disc displacement, lumbar region: Secondary | ICD-10-CM | POA: Diagnosis not present

## 2023-09-22 DIAGNOSIS — M5416 Radiculopathy, lumbar region: Secondary | ICD-10-CM

## 2023-09-22 DIAGNOSIS — R609 Edema, unspecified: Secondary | ICD-10-CM | POA: Diagnosis not present

## 2023-09-22 DIAGNOSIS — M549 Dorsalgia, unspecified: Secondary | ICD-10-CM | POA: Diagnosis not present

## 2023-09-22 DIAGNOSIS — M5441 Lumbago with sciatica, right side: Secondary | ICD-10-CM | POA: Insufficient documentation

## 2023-09-22 DIAGNOSIS — G544 Lumbosacral root disorders, not elsewhere classified: Secondary | ICD-10-CM | POA: Diagnosis not present

## 2023-09-22 DIAGNOSIS — M48061 Spinal stenosis, lumbar region without neurogenic claudication: Secondary | ICD-10-CM | POA: Diagnosis not present

## 2023-09-22 DIAGNOSIS — M47816 Spondylosis without myelopathy or radiculopathy, lumbar region: Secondary | ICD-10-CM | POA: Diagnosis not present

## 2023-09-22 LAB — URINALYSIS, ROUTINE W REFLEX MICROSCOPIC
Bilirubin Urine: NEGATIVE
Glucose, UA: NEGATIVE mg/dL
Hgb urine dipstick: NEGATIVE
Ketones, ur: NEGATIVE mg/dL
Leukocytes,Ua: NEGATIVE
Nitrite: NEGATIVE
Protein, ur: 100 mg/dL — AB
Specific Gravity, Urine: 1.015 (ref 1.005–1.030)
pH: 7 (ref 5.0–8.0)

## 2023-09-22 LAB — POC URINE PREG, ED: Preg Test, Ur: NEGATIVE

## 2023-09-22 MED ORDER — LORAZEPAM 1 MG PO TABS
1.0000 mg | ORAL_TABLET | Freq: Once | ORAL | Status: AC
  Administered 2023-09-22: 1 mg via ORAL
  Filled 2023-09-22: qty 1

## 2023-09-22 MED ORDER — CYCLOBENZAPRINE HCL 5 MG PO TABS: 5.0000 mg | ORAL_TABLET | Freq: Three times a day (TID) | ORAL | 0 refills | Status: DC | PRN

## 2023-09-22 MED ORDER — OXYCODONE-ACETAMINOPHEN 5-325 MG PO TABS
1.0000 | ORAL_TABLET | Freq: Once | ORAL | Status: AC
  Administered 2023-09-22: 1 via ORAL
  Filled 2023-09-22: qty 1

## 2023-09-22 MED ORDER — METHYLPREDNISOLONE 4 MG PO TBPK: ORAL_TABLET | ORAL | 0 refills | Status: DC

## 2023-09-22 NOTE — ED Provider Notes (Signed)
 [facility] Provider Note    Event Date/Time   First MD Initiated Contact with Patient 09/22/23 1029     (approximate)   History   Hip Pain   HPI  Michele Velazquez is a 23 y.o. female history of obesity, previous history of back pain with inflamed nerves presents emergency department with back pain that radiates to the right hip and into the right foot.  Unable to lift her great toe.  Patient states symptoms been ongoing for 2 to 3 days.  Has been unable to dorsiflex toe for 2 days.  No loss of bowel or bladder control.  No known injury.      Physical Exam   Triage Vital Signs: ED Triage Vitals  Encounter Vitals Group     BP 09/22/23 1022 (!) 147/100     Girls Systolic BP Percentile --      Girls Diastolic BP Percentile --      Boys Systolic BP Percentile --      Boys Diastolic BP Percentile --      Pulse Rate 09/22/23 1022 80     Resp 09/22/23 1022 16     Temp 09/22/23 1022 98.5 F (36.9 C)     Temp Source 09/22/23 1022 Oral     SpO2 09/22/23 1022 97 %     Weight 09/22/23 1020 (!) 300 lb 0.7 oz (136.1 kg)     Height --      Head Circumference --      Peak Flow --      Pain Score 09/22/23 1020 6     Pain Loc --      Pain Education --      Exclude from Growth Chart --     Most recent vital signs: Vitals:   09/22/23 1225 09/22/23 1745  BP: 134/64 (!) 149/76  Pulse: 83 80  Resp: 18 18  Temp:    SpO2: 100% 100%     General: Awake, no distress.   CV:  Good peripheral perfusion. Resp:  Normal effort.  Abd:  No distention.   Other:  Lumbar spine nontender, SI joint tender, patient unable to dorsiflex the right great toe, is able to stand on her toes, cannot stand on her heel on the right side, able to lift the leg and knee   ED Results / Procedures / Treatments   Labs (all labs ordered are listed, but only abnormal results are displayed) Labs Reviewed  URINALYSIS, ROUTINE W REFLEX MICROSCOPIC - Abnormal; Notable for the following  components:      Result Value   Color, Urine YELLOW (*)    APPearance CLEAR (*)    Protein, ur 100 (*)    Bacteria, UA RARE (*)    All other components within normal limits  POC URINE PREG, ED - Normal     EKG     RADIOLOGY MRI lumbar spine    PROCEDURES:   Procedures  Critical Care:  no Chief Complaint  Patient presents with   Hip Pain      MEDICATIONS ORDERED IN ED: Medications  LORazepam  (ATIVAN ) tablet 1 mg (1 mg Oral Given 09/22/23 1300)  oxyCODONE -acetaminophen  (PERCOCET/ROXICET) 5-325 MG per tablet 1 tablet (1 tablet Oral Given 09/22/23 1233)  oxyCODONE -acetaminophen  (PERCOCET/ROXICET) 5-325 MG per tablet 1 tablet (1 tablet Oral Given 09/22/23 1747)     IMPRESSION / MDM / ASSESSMENT AND PLAN / ED COURSE  I reviewed the triage vital signs and the nursing notes.  Differential diagnosis includes, but is not limited to, lumbar radiculopathy cauda equina, nerve compression  Patient's presentation is most consistent with acute illness / injury with system symptoms.   Due to the concerns for nerve compression we will go ahead and do MRI lumbar spine.  I did ask patient several times where she is not moving it because it hurts and she keeps denying so feel that the inability to dorsiflex the right great toe is accurate  Patient fell while trying to walk to the bathroom.  States her croc got caught on the floor.  I did question her whether she thinks is because she is not able to lift her foot.  She says that might be the problem she does not really know.  She was placed in a wheelchair to return to her room, fall risk bracelet applied, and she is not to get up and walk without notifying us  so we can put her in a wheelchair.  States that is never happened before.  No known family history of MS   MRI results independently reviewed interpreted by me shows nerve impingement at S1  1. At L5-S1, severe right subarticular recess stenosis with   impingement of the descending right S1 nerve roots. Moderate left  subarticular recess stenosis and mild left foraminal stenosis.  2. At L4-L5, right far lateral foraminal disc protrusion with likely  at least moderate right foraminal stenosis. The exiting/exited right  L4 nerve appears inflamed/edematous. Moderate left and mild right  subarticular recess stenosis.  3. At L3-L4, moderate left and mild right subarticular recess  stenosis.  4. At L2-L3, moderate left subarticular recess stenosis.    I did explain his findings patient.  Consult to neurosurgery.  Consult to Dr. Katrina, is coming to see the patient  Patient has foot drop, Dr Katrina planning for surgery depending on patient's decision Care transferred to [facility], PA-C  FINAL CLINICAL IMPRESSION(S) / ED DIAGNOSES   Final diagnoses:  Acute midline low back pain with right-sided sciatica  Lumbar nerve root impingement  Foot drop, right     Rx / DC Orders   ED Discharge Orders     None        Note:  This document was prepared using Dragon voice recognition software and may include unintentional dictation errors.    Gasper Devere ORN, PA-C 09/22/23 JACKEY Arlander [place], MD 09/24/23 [phone]

## 2023-09-22 NOTE — Discharge Instructions (Addendum)
 Take the prescription meds as directed. Follow-up with Neurology as discussed. Return to the ED if needed.

## 2023-09-22 NOTE — ED Notes (Signed)
 I called MRI to get an update on when the pt would be taken back. They advised hopefully within the next hour. Delay explained to pt and family. They expressed understanding.

## 2023-09-22 NOTE — ED Notes (Addendum)
 See triage notes. Pt reports hx of nerve pain in same leg/hip. Pt reports being seen today to get work note with limitations. Pt ambulated without issues to the exam room from triage.

## 2023-09-22 NOTE — ED Notes (Signed)
 ED post fall huddle form completed and given to charge RN.

## 2023-09-22 NOTE — ED Notes (Addendum)
 While walking to the restroom, the pt fell and hit her knees on the ground. Pt immediately got herself up. Pt reports that she tripped over her crocs. Pt denies getting injured. Fall risk bundle implemented at this time.

## 2023-09-22 NOTE — ED Notes (Signed)
 Pt taken to the restroom via wheelchair.

## 2023-09-22 NOTE — Consult Note (Addendum)
 Consult requested by:  Devere Perry PA  Consult requested for:  R foot drop  Primary Physician:  Pcp, No  History of Present Illness: 09/22/2023 [name] is here today with a chief complaint of R foot drop.  She had a prior episode of pain down her right leg 2 years ago.  During this episode, she began having pain a few days ago and started having weakness yesterday.  This worsened through the day yesterday until she began having difficulty walking.  She presented today for evaluation.  She reports that she has pain in her right buttock and down the back and side of her leg when she stands.  She has noted abnormal ambulation.  She denies bowel or bladder symptoms.  In her prior episode 2 years ago, she had steroids and anti-inflammatories and her symptoms resolved.  The symptoms are causing a significant impact on the patient's life.   I have utilized the care everywhere function in epic to review the outside records available from external health systems.  Review of Systems:  A 10 point review of systems is negative, except for the pertinent positives and negatives detailed in the HPI.  Past Medical History: No past medical history on file.  Past Surgical History: No past surgical history on file.  Allergies: Allergies as of 09/22/2023 - Review Complete 09/22/2023  Allergen Reaction Noted   Penicillins Anaphylaxis 06/01/2020   Sulfa antibiotics Hives 07/25/2014   Bactrim [sulfamethoxazole-trimethoprim] Rash 07/26/2014    Medications: No outpatient medications have been marked as taking for the 09/22/23 encounter [facility]).    Social History: Social History   Tobacco Use   Smoking status: Never   Smokeless tobacco: Never  Substance Use Topics   Alcohol use: No    Family Medical History: No family history on file.  Physical Examination: Vitals:   09/22/23 1225 09/22/23 1745  BP: 134/64 (!) 149/76  Pulse: 83 80  Resp: 18 18  Temp:     SpO2: 100% 100%    General: Patient is in no apparent distress. Attention to examination is appropriate.  Neck:   Supple.  Full range of motion.  Respiratory: Patient is breathing without any difficulty.   NEUROLOGICAL:     Awake, alert, oriented to person, place, and time.  Speech is clear and fluent.  Cranial Nerves: Pupils equal round and reactive to light.  Facial tone is symmetric.  Facial sensation is symmetric. Shoulder shrug is symmetric. Tongue protrusion is midline.  There is no pronator drift.  Strength:  Side Iliopsoas Quads Hamstring PF DF EHL  R 5 5 5 3 1 1   L 5 5 5 5 5 5    R inversion 2/5 Eversion   Bilateral upper and lower extremity sensation is intact to light touch.    No evidence of dysmetria noted.  Gait is abnormal - has R foot drop.     Medical Decision Making  Imaging: MRI L spine 09/22/2023 IMPRESSION: 1. At L5-S1, severe right subarticular recess stenosis with impingement of the descending right S1 nerve roots. Moderate left subarticular recess stenosis and mild left foraminal stenosis. 2. At L4-L5, right far lateral foraminal disc protrusion with likely at least moderate right foraminal stenosis. The exiting/exited right L4 nerve appears inflamed/edematous. Moderate left and mild right subarticular recess stenosis. 3. At L3-L4, moderate left and mild right subarticular recess stenosis. 4. At L2-L3, moderate left subarticular recess stenosis.     Electronically Signed   By: Gilmore GORMAN Molt  M.D.   On: 09/22/2023 14:09  I have personally reviewed the images and agree with the above interpretation.  Assessment and Plan: [name] is a pleasant 23 y.o. female with symptoms most consistent with lumbar radiculopathy causing weakness impacting her right ankle dorsiflexion and plantarflexion.  I think this is unlikely to be peroneal nerve related given her impaired eversion as well as  Her symptoms have been worsening over the past 36  hours.  I have reviewed this with her and recommended surgical intervention on an urgent basis this evening.  She has asked for a nonsurgical option, and elected for a steroid taper with close monitoring.  She expressed understanding regarding her decision not to pursue surgical intervention.  We will start a steroid taper and arrange for outpatient physical therapy and epidural steroid injection.  She should continue precautions with regard to lifting and bending.  I have communicated my recommendations to the requesting physician and coordinated care to facilitate these recommendations.     Michele Velazquez K. Clois MD, [facility] Neurosurgery

## 2023-09-22 NOTE — ED Notes (Signed)
 Pt relaxing in bed at this time and is in NAD. Family with pt.

## 2023-09-22 NOTE — ED Triage Notes (Signed)
 C/O right hip pain radiating to right foot. Worse with ambulation.  Denies injury.  STates has had inflammed nerves to area. Also states right leg feels weaker x 2-3 days.  AAOx3.  Skin warm and dry. NAD. Gait steady.

## 2023-09-23 NOTE — Progress Notes (Signed)
 Referring Physician:  No referring provider defined for this encounter.  Primary Physician:  Pcp, No  History of Present Illness: 09/28/2023 [name] is here today with a chief complaint of right leg pain as well as weakness in her distal right leg.  I saw her as a consultation in the emergency department last week.  She has been on a steroid taper.  This has not really helped her.   09/22/2023 [name] is here today with a chief complaint of R foot drop.  She had a prior episode of pain down her right leg 2 years ago.  During this episode, she began having pain a few days ago and started having weakness yesterday.  This worsened through the day yesterday until she began having difficulty walking.  She presented today for evaluation.  She reports that she has pain in her right buttock and down the back and side of her leg when she stands.  She has noted abnormal ambulation.   She denies bowel or bladder symptoms.   In her prior episode 2 years ago, she had steroids and anti-inflammatories and her symptoms resolved.   The symptoms are causing a significant impact on the patient's life.    I have utilized the care everywhere function in epic to review the outside records available from external health systems.      Review of Systems:  A 10 point review of systems is negative, except for the pertinent positives and negatives detailed in the HPI.  Past Medical History: No past medical history on file.  Past Surgical History: No past surgical history on file.  Allergies: Allergies as of 09/28/2023 - Review Complete 09/28/2023  Allergen Reaction Noted   Penicillins Anaphylaxis 06/01/2020   Sulfa antibiotics Hives 07/25/2014   Bactrim [sulfamethoxazole-trimethoprim] Rash 07/26/2014    Medications:  Current Outpatient Medications:    cyclobenzaprine  (FLEXERIL ) 5 MG tablet, Take 1 tablet (5 mg total) by mouth 3 (three) times daily as needed., Disp: 30 tablet,  Rfl: 0  Social History: Social History   Tobacco Use   Smoking status: Never   Smokeless tobacco: Never  Substance Use Topics   Alcohol use: No    Family Medical History: No family history on file.  Physical Examination: Vitals:   09/28/23 1621  BP: 128/82    General: Patient is in no apparent distress. Attention to examination is appropriate.  Neck:   Supple.  Full range of motion.  Respiratory: Patient is breathing without any difficulty.   NEUROLOGICAL:     Awake, alert, oriented to person, place, and time.  Speech is clear and fluent.   Cranial Nerves: Pupils equal round and reactive to light.  Facial tone is symmetric.  Facial sensation is symmetric. Shoulder shrug is symmetric. Tongue protrusion is midline.   Strength:  Side Iliopsoas Quads Hamstring PF DF EHL  R 5 5 5 3 3 3   L 5 5 5 5 5 5     Bilateral upper and lower extremity sensation is intact to light touch.    No evidence of dysmetria noted.  Gait is abnormal due to foot drop.     Medical Decision Making  Imaging: MRI L spine 09/22/2023 IMPRESSION: 1. At L5-S1, severe right subarticular recess stenosis with impingement of the descending right S1 nerve roots. Moderate left subarticular recess stenosis and mild left foraminal stenosis. 2. At L4-L5, right far lateral foraminal disc protrusion with likely at least moderate right foraminal stenosis. The exiting/exited right L4  nerve appears inflamed/edematous. Moderate left and mild right subarticular recess stenosis. 3. At L3-L4, moderate left and mild right subarticular recess stenosis. 4. At L2-L3, moderate left subarticular recess stenosis.     Electronically Signed   By: Gilmore GORMAN Molt M.D.   On: 09/22/2023 14:09  I have personally reviewed the images and agree with the above interpretation.  Assessment and Plan: Ms. Seres is a pleasant 23 y.o. female with foot drop due to right L4 and right S1 nerve root impingements.  She has not  improved with steroids.  We reviewed the options.  I have previously discussed and offered her early surgery.  She has declined and would like to try conservative management.  As such, I will start her on physical therapy and refer her for an injection.  I would like to see her back in 6 to 8 weeks.  We reviewed that nonsteroidal medication was safe for her to take after she finishes her steroids.  Additionally, she can use cyclobenzaprine  as needed.  We do not utilize narcotics for this condition.  I spent a total of 20 minutes in this patient's care today. This time was spent reviewing pertinent records including imaging studies, obtaining and confirming history, performing a directed evaluation, formulating and discussing my recommendations, and documenting the visit within the medical record.      Thank you for involving me in the care of this patient.      Melis Trochez K. Clois MD, [facility] Neurosurgery

## 2023-09-28 ENCOUNTER — Ambulatory Visit (INDEPENDENT_AMBULATORY_CARE_PROVIDER_SITE_OTHER): Admitting: Neurosurgery

## 2023-09-28 ENCOUNTER — Encounter: Payer: Self-pay | Admitting: Neurosurgery

## 2023-09-28 VITALS — BP 128/82 | Wt 246.0 lb

## 2023-09-28 DIAGNOSIS — G544 Lumbosacral root disorders, not elsewhere classified: Secondary | ICD-10-CM | POA: Diagnosis not present

## 2023-09-28 DIAGNOSIS — M21371 Foot drop, right foot: Secondary | ICD-10-CM | POA: Diagnosis not present

## 2023-09-28 DIAGNOSIS — M5416 Radiculopathy, lumbar region: Secondary | ICD-10-CM

## 2023-09-28 DIAGNOSIS — Z419 Encounter for procedure for purposes other than remedying health state, unspecified: Secondary | ICD-10-CM | POA: Diagnosis not present

## 2023-10-29 DIAGNOSIS — Z419 Encounter for procedure for purposes other than remedying health state, unspecified: Secondary | ICD-10-CM | POA: Diagnosis not present

## 2023-11-30 ENCOUNTER — Ambulatory Visit (INDEPENDENT_AMBULATORY_CARE_PROVIDER_SITE_OTHER): Admitting: Neurosurgery

## 2023-11-30 ENCOUNTER — Encounter: Payer: Self-pay | Admitting: Neurosurgery

## 2023-11-30 VITALS — BP 132/86 | Ht 66.0 in | Wt 246.0 lb

## 2023-11-30 DIAGNOSIS — M5416 Radiculopathy, lumbar region: Secondary | ICD-10-CM

## 2023-11-30 DIAGNOSIS — M21371 Foot drop, right foot: Secondary | ICD-10-CM

## 2023-11-30 NOTE — Progress Notes (Signed)
 Referring Physician:  No referring provider defined for this encounter.  Primary Physician:  Pcp, No  History of Present Illness: 11/30/2023 She is feeling somewhat better.  09/28/2023 [name] is here today with a chief complaint of right leg pain as well as weakness in her distal right leg.  I saw her as a consultation in the emergency department last week.  She has been on a steroid taper.  This has not really helped her.   09/22/2023 [name] is here today with a chief complaint of R foot drop.  She had a prior episode of pain down her right leg 2 years ago.  During this episode, she began having pain a few days ago and started having weakness yesterday.  This worsened through the day yesterday until she began having difficulty walking.  She presented today for evaluation.  She reports that she has pain in her right buttock and down the back and side of her leg when she stands.  She has noted abnormal ambulation.   She denies bowel or bladder symptoms.   In her prior episode 2 years ago, she had steroids and anti-inflammatories and her symptoms resolved.   The symptoms are causing a significant impact on the patient's life.    I have utilized the care everywhere function in epic to review the outside records available from external health systems.      Review of Systems:  A 10 point review of systems is negative, except for the pertinent positives and negatives detailed in the HPI.  Past Medical History: History reviewed. No pertinent past medical history.  Past Surgical History: History reviewed. No pertinent surgical history.  Allergies: Allergies as of 11/30/2023 - Review Complete 11/30/2023  Allergen Reaction Noted   Penicillins Anaphylaxis 06/01/2020   Sulfa antibiotics Hives 07/25/2014   Bactrim [sulfamethoxazole-trimethoprim] Rash 07/26/2014    Medications:  Current Outpatient Medications:    ibuprofen  (ADVIL ) 200 MG tablet, Take 200 mg  by mouth every 6 (six) hours as needed., Disp: , Rfl:   Social History: Social History   Tobacco Use   Smoking status: Never   Smokeless tobacco: Never  Substance Use Topics   Alcohol use: No    Family Medical History: History reviewed. No pertinent family history.  Physical Examination: Vitals:   11/30/23 1310  BP: 132/86      Medical Decision Making  Imaging: MRI L spine 09/22/2023 IMPRESSION: 1. At L5-S1, severe right subarticular recess stenosis with impingement of the descending right S1 nerve roots. Moderate left subarticular recess stenosis and mild left foraminal stenosis. 2. At L4-L5, right far lateral foraminal disc protrusion with likely at least moderate right foraminal stenosis. The exiting/exited right L4 nerve appears inflamed/edematous. Moderate left and mild right subarticular recess stenosis. 3. At L3-L4, moderate left and mild right subarticular recess stenosis. 4. At L2-L3, moderate left subarticular recess stenosis.     Electronically Signed   By: Gilmore GORMAN Molt M.D.   On: 09/22/2023 14:09  I have personally reviewed the images and agree with the above interpretation.  Assessment and Plan: Michele Velazquez is a pleasant 23 y.o. female with foot drop due to right L4 and right S1 nerve root impingements.  She has not improved with steroids.  She has not yet started physical therapy.  I will send her back for physical therapy.  She is having her first consultation for an injection in 2 weeks.  I will see her back in November.  I spent  a total of 3 minutes in this patient's care today. This time was spent reviewing pertinent records including imaging studies, obtaining and confirming history, performing a directed evaluation, formulating and discussing my recommendations, and documenting the visit within the medical record.      Thank you for involving me in the care of this patient.      Abdulahad Mederos K. Clois MD, [facility] Neurosurgery

## 2023-12-09 IMAGING — US US BREAST*L* LIMITED INC AXILLA
1 series · 8 of 8 positions shown · non-contrast
Comparison: None

CLINICAL DATA: Mass left breast, possible abscess

EXAM:
ULTRASOUND OF THE LEFT BREAST

[Series 1: us breast ltd uni left inc axilla · 8 of 8 slices shown]
[im 1/8]
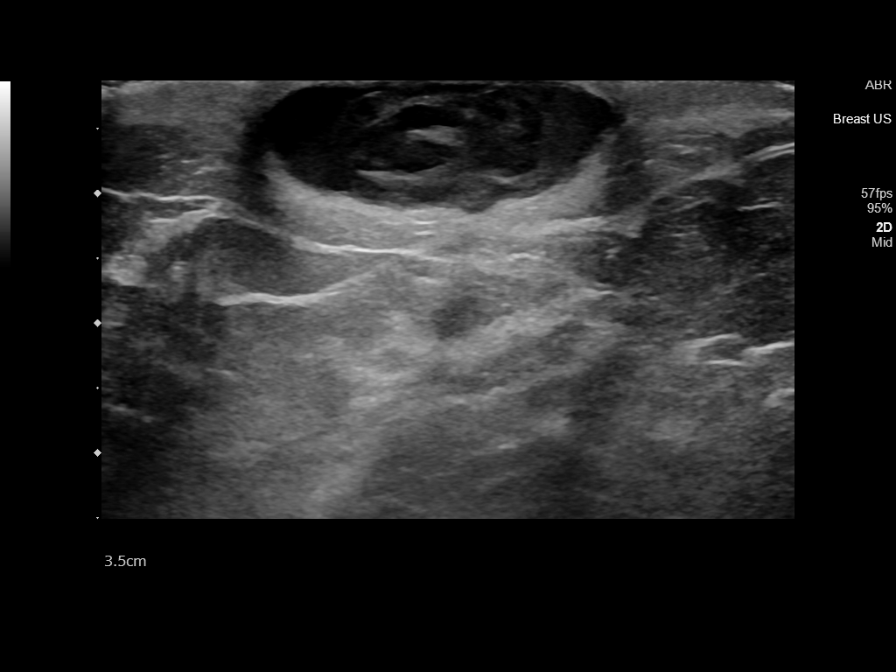
[im 2/8]
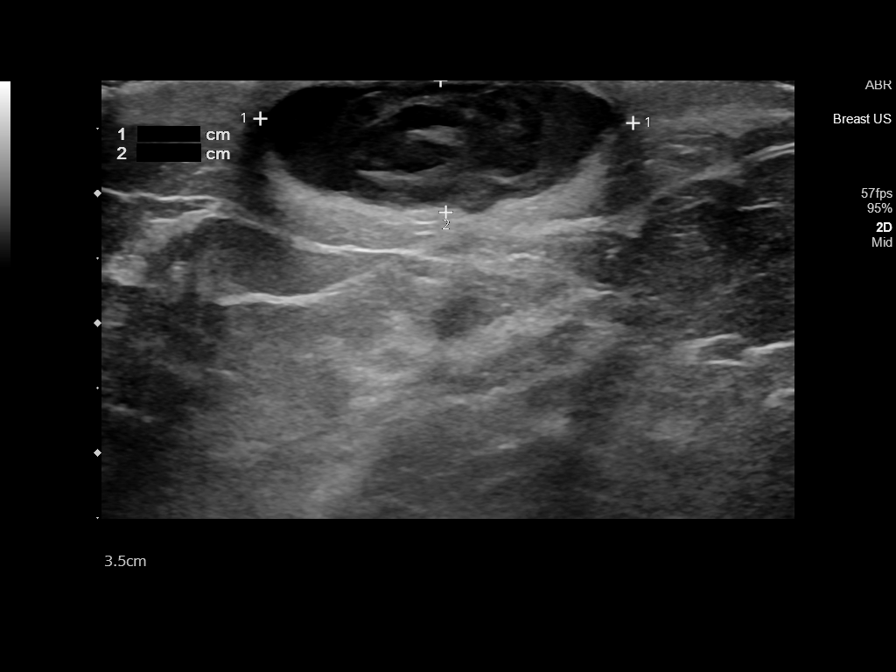
[im 3/8]
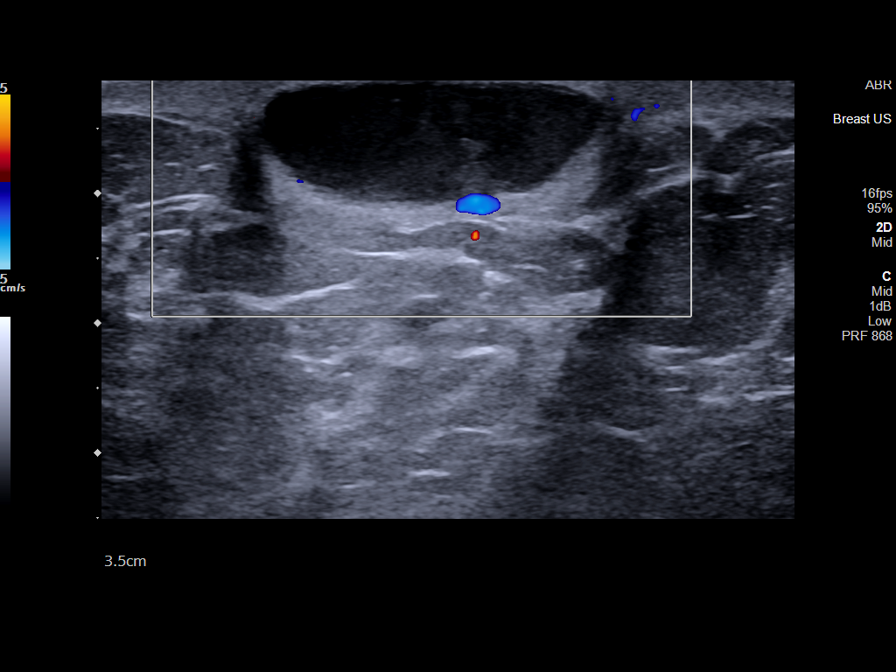
[im 4/8]
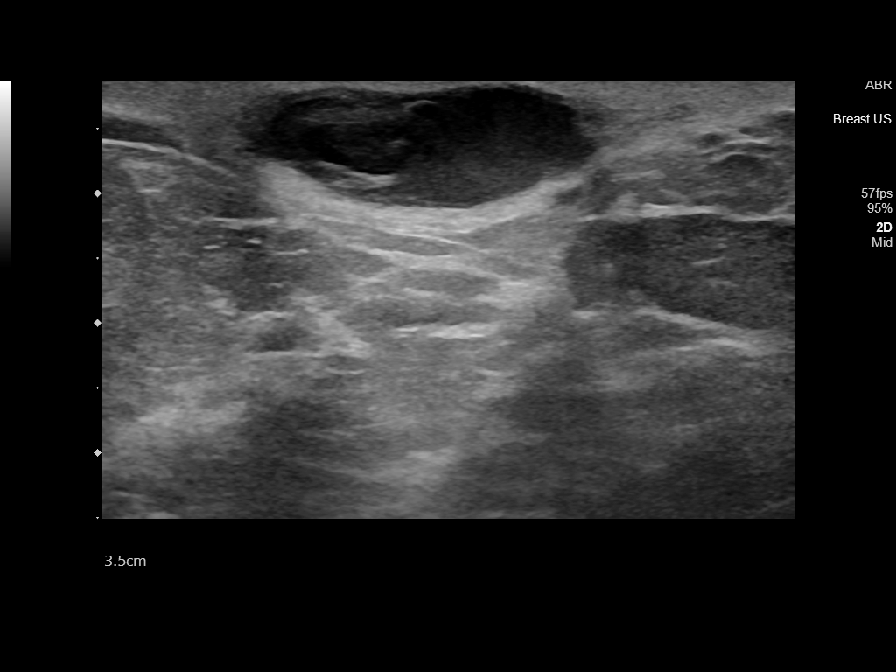
[im 5/8]
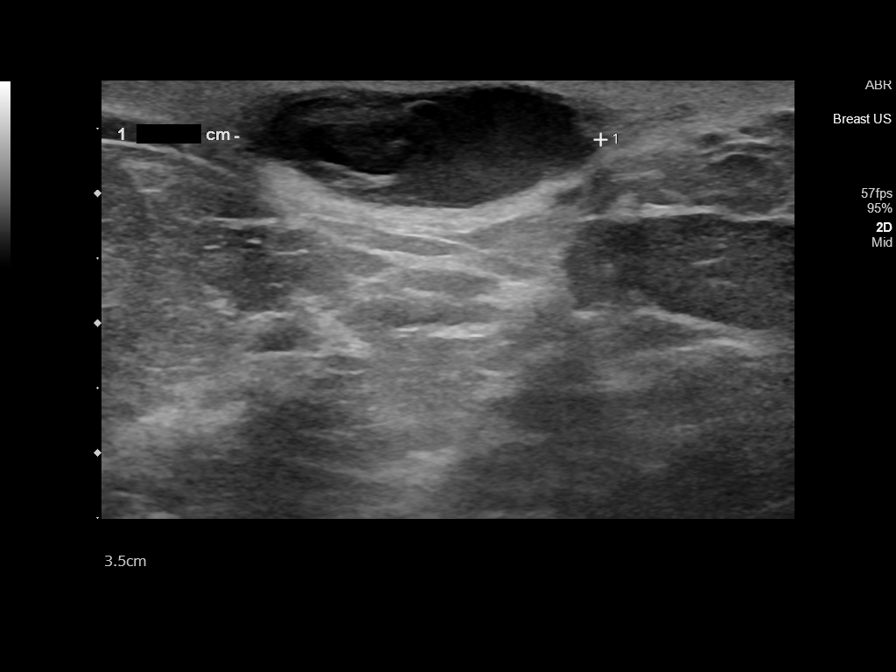
[im 6/8]
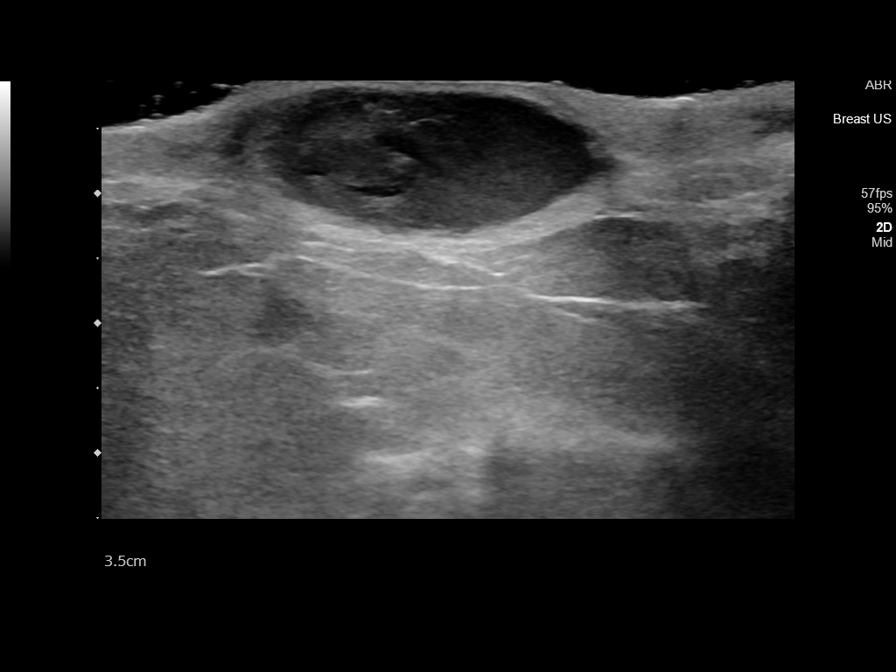
[im 7/8]
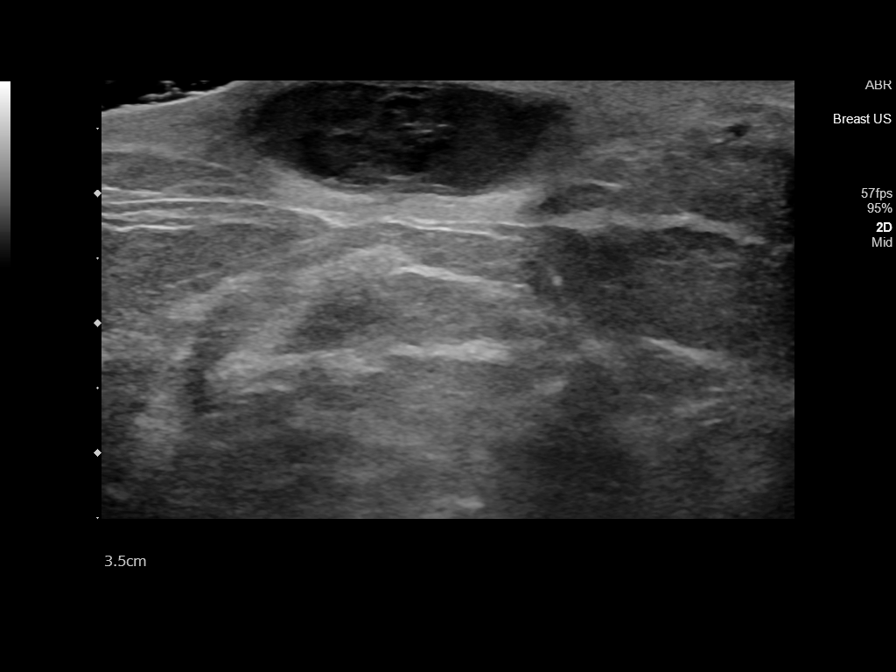
[im 8/8]
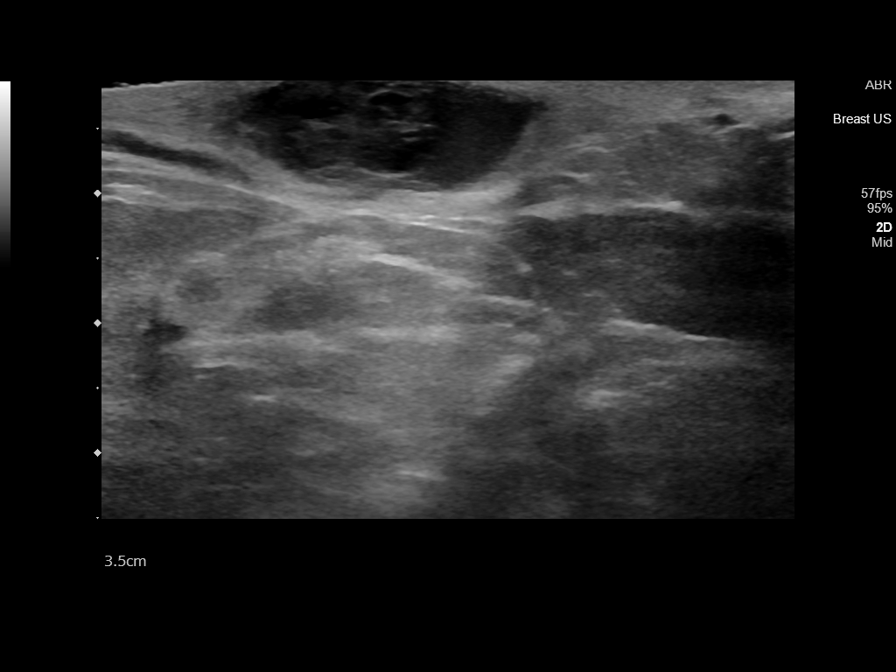

[8 of 8 positions shown; findings below may reference images not displayed]

FINDINGS: Targeted ultrasound is performed, showing complex fluid collection
measuring 2.9 x 2.8 x 1.0 cm at the 3 o'clock position of the left
breast 8 cm from the nipple. Appearance is concerning for possible
abscess.
IMPRESSION: Complex fluid collection at 3 o'clock position of the left breast,
measured above, concerning for abscess.

## 2023-12-14 DIAGNOSIS — Z79899 Other long term (current) drug therapy: Secondary | ICD-10-CM | POA: Insufficient documentation

## 2023-12-14 DIAGNOSIS — M899 Disorder of bone, unspecified: Secondary | ICD-10-CM | POA: Insufficient documentation

## 2023-12-14 DIAGNOSIS — Z789 Other specified health status: Secondary | ICD-10-CM | POA: Insufficient documentation

## 2023-12-14 DIAGNOSIS — G894 Chronic pain syndrome: Secondary | ICD-10-CM | POA: Insufficient documentation

## 2023-12-14 NOTE — Progress Notes (Unsigned)
 PROVIDER NOTE: Interpretation of information contained herein should be left to medically-trained personnel. Specific patient instructions are provided elsewhere under Patient Instructions section of medical record. This document was created in part using AI and STT-dictation technology, any transcriptional errors that may result from this process are unintentional.  Patient: Michele Velazquez  Service: E/M Encounter  Provider: Eric DELENA Como, MD  DOB: 05-May-2000  Delivery: Face-to-face  Specialty: Interventional Pain Management  MRN: [id number]  Setting: Ambulatory outpatient facility  Specialty designation: 09  Type: New Patient  Location: Outpatient office facility  PCP: Pcp, No  DOS: 12/15/2023    Referring Prov.: Clois Fret, MD   Primary Reason(s) for Visit: Encounter for initial evaluation of one or more chronic problems (new to examiner) potentially causing chronic pain, and posing a threat to normal musculoskeletal function. (Level of risk: High) CC: No chief complaint on file.  HPI  Ms. Fake is a 23 y.o. year old, female patient, who comes for the first time to our practice referred by Clois Fret, MD for our initial evaluation of her chronic pain. She has Cellulitis of submandibular region; Obesity, Class III, BMI 40-49.9 (morbid obesity) (HCC); Sinus tachycardia; Elevated blood pressure reading; Foot drop, right; Lumbar nerve root impingement; Chronic pain syndrome; Pharmacologic therapy; Disorder of skeletal system; and Problems influencing health status on their problem list. Today she comes in for evaluation of her No chief complaint on file.  Pain Assessment: Location:     Radiating:   Onset:   Duration:   Quality:   Severity:  /10 (subjective, self-reported pain score)  Effect on ADL:   Timing:   Modifying factors:   BP:    HR:    Onset and Duration: {Hx; Onset and Duration:210120511} Cause of pain: {Hx; Cause:210120521} Severity: {Pain  Severity:210120502} Timing: {Symptoms; Timing:210120501} Aggravating Factors: {Causes; Aggravating pain factors:210120507} Alleviating Factors: {Causes; Alleviating Factors:210120500} Associated Problems: {Hx; Associated problems:210120515} Quality of Pain: {Hx; Symptom quality or Descriptor:210120531} Previous Examinations or Tests: {Hx; Previous examinations or test:210120529} Previous Treatments: {Hx; Previous Treatment:210120503}  Ms. Newhard is being evaluated for possible interventional pain management therapies for the treatment of her chronic pain.  Discussed the use of AI scribe software for clinical note transcription with the patient, who gave verbal consent to proceed.  History of Present Illness            Ms. Mccance has been informed that this initial visit was an evaluation only.  On the follow up appointment I will go over the results, including ordered tests and available interventional therapies. At that time she will have the opportunity to decide whether to proceed with offered therapies or not. In the event that Ms. Algeo prefers avoiding interventional options, this will conclude our involvement in the case.  Medication management recommendations may be provided upon request.  Patient informed that diagnostic tests may be ordered to assist in identifying underlying causes, narrow the list of differential diagnoses and aid in determining candidacy for (or contraindications to) planned therapeutic interventions.  Historic Controlled Substance Pharmacotherapy Review PMP and historical list of controlled substances: None Most recently prescribed controlled substance(s): Opioid Analgesic: None MME/day: 0 mg/day  Historical Monitoring: The patient  has no history on file for drug use. List of prior UDS Testing: No results found for: MDMA, COCAINSCRNUR, PCPSCRNUR, PCPQUANT, CANNABQUANT, THCU, ETH, CBDTHCR, D8THCCBX, D9THCCBX Historical Background  Evaluation: [place] PMP: PDMP reviewed during this encounter. Review of the past 52-months conducted.  PMP NARX Score Report:  Narcotic: 000 Sedative: 000 Stimulant: 000 [place] Department of public safety, offender search: Engineer, [employer] Information) Non-contributory Risk Assessment Profile: Aberrant behavior: None observed or detected today Risk factors for fatal opioid overdose: None identified today PMP NARX Overdose Risk Score: 000 Fatal overdose hazard ratio (HR): Calculation deferred Non-fatal overdose hazard ratio (HR): Calculation deferred Risk of opioid abuse or dependence: 0.7-3.0% with doses <= 36 MME/day and 6.1-26% with doses >= 120 MME/day. Substance use disorder (SUD) risk level: See below Personal History of Substance Abuse (SUD-Substance use disorder):  Alcohol:    Illegal Drugs:    Rx Drugs:    ORT Risk Level calculation:    ORT Scoring interpretation table:  Score <3 = Low Risk for SUD  Score between 4-7 = Moderate Risk for SUD  Score >8 = High Risk for Opioid Abuse   PHQ-2 Depression Scale:  Total score:    PHQ-2 Scoring interpretation table: (Score and probability of major depressive disorder)  Score 0 = No depression  Score 1 = 15.4% Probability  Score 2 = 21.1% Probability  Score 3 = 38.4% Probability  Score 4 = 45.5% Probability  Score 5 = 56.4% Probability  Score 6 = 78.6% Probability   PHQ-9 Depression Scale:  Total score:    PHQ-9 Scoring interpretation table:  Score 0-4 = No depression  Score 5-9 = Mild depression  Score 10-14 = Moderate depression  Score 15-19 = Moderately severe depression  Score 20-27 = Severe depression (2.4 times higher risk of SUD and 2.89 times higher risk of overuse)   Pharmacologic Plan: As per protocol, I have not taken over any controlled substance management, pending the results of ordered tests and/or consults.            Initial impression: Pending review of available data and ordered tests.  Meds   Current  Outpatient Medications:    ibuprofen  (ADVIL ) 200 MG tablet, Take 200 mg by mouth every 6 (six) hours as needed., Disp: , Rfl:   Imaging Review  Lumbosacral Imaging: Lumbar MR wo contrast: Results for orders placed during the hospital encounter of 09/22/23 MR LUMBAR SPINE WO CONTRAST  Narrative CLINICAL DATA:  Low back pain, cauda equina syndrome suspected  EXAM: MRI LUMBAR SPINE WITHOUT CONTRAST  TECHNIQUE: Multiplanar, multisequence MR imaging of the lumbar spine was performed. No intravenous contrast was administered.  COMPARISON:  Lumbar radiographs June 01, 2020.  FINDINGS: Segmentation: Standard.  Alignment:  Normal.  Vertebrae: No evidence of acute fracture, discitis/osteomyelitis, or suspicious bone lesion. Vertebral venous malformation L5. Degenerative inferior L3 endplate marrow signal changes.  Conus medullaris and cauda equina: Conus extends to the L1-L2 level. Conus and cauda equina appear normal.  Paraspinal and other soft tissues: Unremarkable.  Disc levels:  T12-L1: No significant disc protrusion, foraminal stenosis, or canal stenosis.  L1-L2: Mild disc bulge and facet arthropathy with superimposed inferiorly directed central and left subarticular disc protrusion. Resulting mild left subarticular recess stenosis. Mild effacement of ventral CSF without significant central canal stenosis. No significant foraminal stenosis.  L2-L3: Inferiorly directed right subarticular disc protrusion with moderate left subarticular recess stenosis. Central canal is patent. Patent foramina.  L3-L4: Broad disc bulging with moderate left and mild right subarticular recess stenosis. Mild central canal stenosis. Patent foramina.  L4-L5: Broad disc bulge with ligamentum flavum thickening facet arthropathy. Superimposed right far lateral foraminal disc protrusion with likely at least moderate right foraminal stenosis. The exiting/exited right L4 nerve appears  inflamed/edematous (for example see  series 6, image 4). Moderate left and mild right subarticular recess stenosis. Patent left foramen.  L5-S1: Broad disc bulge with superimposed right subarticular disc protrusion. Resulting severe right subarticular recess stenosis and impingement of the descending right S1 nerve roots. Moderate left subarticular recess stenosis. Mild left foraminal stenosis.  IMPRESSION: 1. At L5-S1, severe right subarticular recess stenosis with impingement of the descending right S1 nerve roots. Moderate left subarticular recess stenosis and mild left foraminal stenosis. 2. At L4-L5, right far lateral foraminal disc protrusion with likely at least moderate right foraminal stenosis. The exiting/exited right L4 nerve appears inflamed/edematous. Moderate left and mild right subarticular recess stenosis. 3. At L3-L4, moderate left and mild right subarticular recess stenosis. 4. At L2-L3, moderate left subarticular recess stenosis.   Electronically Signed By: Gilmore GORMAN Molt M.D. On: 09/22/2023 14:09  Lumbar DG 2-3 views: Results for orders placed during the hospital encounter of 06/01/20 DG Lumbar Spine 2-3 Views  Narrative CLINICAL DATA:  Low back pain, no known injury, initial encounter  EXAM: LUMBAR SPINE - 3 VIEW  COMPARISON:  None.  FINDINGS: Five lumbar type vertebral bodies are well visualized. Vertebral body height is well maintained. No anterolisthesis is noted. No soft tissue abnormality is noted.  IMPRESSION: No acute abnormality noted.   Electronically Signed By: Oneil Devonshire M.D. On: 06/01/2020 20:11  Ankle Imaging: Ankle-R DG Complete: Results for orders placed during the hospital encounter of 05/18/17 DG Ankle Complete Right  Narrative CLINICAL DATA:  Clemens yesterday, twisted RIGHT ankle.  EXAM: RIGHT ANKLE - COMPLETE 3+ VIEW  COMPARISON:  None.  FINDINGS: There is no evidence of fracture, dislocation, or joint  effusion. There is no evidence of arthropathy or other focal bone abnormality. Mild soft tissue swelling.  IMPRESSION: Negative for fracture.  Mild soft tissue swelling.   Electronically Signed By: Norleen ONEIDA Catching M.D. On: 05/18/2017 15:03  Complexity Note: Imaging results reviewed.                         ROS  Cardiovascular: {Hx; Cardiovascular History:210120525} Pulmonary or Respiratory: {Hx; Pumonary and/or Respiratory History:210120523} Neurological: {Hx; Neurological:210120504} Psychological-Psychiatric: {Hx; Psychological-Psychiatric History:210120512} Gastrointestinal: {Hx; Gastrointestinal:210120527} Genitourinary: {Hx; Genitourinary:210120506} Hematological: {Hx; Hematological:210120510} Endocrine: {Hx; Endocrine history:210120509} Rheumatologic: {Hx; Rheumatological:210120530} Musculoskeletal: {Hx; Musculoskeletal:210120528} Work History: {Hx; Work history:210120514}  Allergies  Ms. Pulcini is allergic to penicillins, sulfa antibiotics, and bactrim [sulfamethoxazole-trimethoprim].  Laboratory Chemistry Profile   Renal Lab Results  Component Value Date   BUN 8 11/08/2021   CREATININE 0.80 11/08/2021   GFRNONAA >60 11/08/2021   PROTEINUR 100 (A) 09/22/2023     Electrolytes Lab Results  Component Value Date   NA 140 11/08/2021   K 4.0 11/08/2021   CL 107 11/08/2021   CALCIUM 9.5 11/08/2021     Hepatic No results found for: AST, ALT, ALBUMIN, ALKPHOS, AMYLASE, LIPASE, AMMONIA   ID Lab Results  Component Value Date   HIV Non Reactive 11/07/2021   SARSCOV2NAA NEGATIVE 04/02/2021   PREGTESTUR Negative 09/22/2023     Bone No results found for: VD25OH, CI874NY7UNU, CI6874NY7, CI7874NY7, 25OHVITD1, 25OHVITD2, 25OHVITD3, TESTOFREE, TESTOSTERONE   Endocrine Lab Results  Component Value Date   GLUCOSE 216 (H) 11/08/2021   GLUCOSEU NEGATIVE 09/22/2023     Neuropathy Lab Results  Component Value Date   HIV Non Reactive  11/07/2021     CNS No results found for: COLORCSF, APPEARCSF, RBCCOUNTCSF, WBCCSF, POLYSCSF, LYMPHSCSF, EOSCSF, PROTEINCSF, GLUCCSF, JCVIRUS, CSFOLI, IGGCSF, LABACHR, ACETBL   Inflammation (CRP:  Acute  ESR: Chronic) Lab Results  Component Value Date   CRP 9.1 (H) 11/07/2021   ESRSEDRATE 60 (H) 11/07/2021     Rheumatology No results found for: RF, ANA, LABURIC, URICUR, LYMEIGGIGMAB, LYMEABIGMQN, HLAB27   Coagulation Lab Results  Component Value Date   PLT 412 (H) 11/07/2021     Cardiovascular Lab Results  Component Value Date   HGB 12.1 11/07/2021   HCT 39.4 11/07/2021     Screening Lab Results  Component Value Date   SARSCOV2NAA NEGATIVE 04/02/2021   HIV Non Reactive 11/07/2021   PREGTESTUR Negative 09/22/2023     Cancer No results found for: CEA, CA125, LABCA2   Allergens No results found for: ALMOND, APPLE, ASPARAGUS, AVOCADO, BANANA, BARLEY, BASIL, BAYLEAF, GREENBEAN, LIMABEAN, WHITEBEAN, BEEFIGE, REDBEET, BLUEBERRY, BROCCOLI, CABBAGE, MELON, CARROT, CASEIN, CASHEWNUT, CAULIFLOWER, CELERY     Note: Lab results reviewed.  PFSH  Drug: Ms. Grigg  has no history on file for drug use. Alcohol:  reports no history of alcohol use. Tobacco:  reports that she has never smoked. She has never used smokeless tobacco. Medical:  has no past medical history on file. Family: family history is not on file.  No past surgical history on file. Active Ambulatory Problems    Diagnosis Date Noted   Cellulitis of submandibular region 11/07/2021   Obesity, Class III, BMI 40-49.9 (morbid obesity) (HCC) 11/07/2021   Sinus tachycardia 11/07/2021   Elevated blood pressure reading 11/07/2021   Foot drop, right 09/22/2023   Lumbar nerve root impingement 09/22/2023   Chronic pain syndrome 12/14/2023   Pharmacologic therapy 12/14/2023   Disorder of skeletal system 12/14/2023   Problems  influencing health status 12/14/2023   Resolved Ambulatory Problems    Diagnosis Date Noted   No Resolved Ambulatory Problems   No Additional Past Medical History   Constitutional Exam  General appearance: Well nourished, well developed, and well hydrated. In no apparent acute distress There were no vitals filed for this visit. BMI Assessment: Estimated body mass index is 39.71 kg/m as calculated from the following:   Height as of 11/30/23: 5' 6 (1.676 m).   Weight as of 11/30/23: 246 lb (111.6 kg).  BMI interpretation table: BMI level Category Range association with higher incidence of chronic pain  <18 kg/m2 Underweight   18.5-24.9 kg/m2 Ideal body weight   25-29.9 kg/m2 Overweight Increased incidence by 20%  30-34.9 kg/m2 Obese (Class I) Increased incidence by 68%  35-39.9 kg/m2 Severe obesity (Class II) Increased incidence by 136%  >40 kg/m2 Extreme obesity (Class III) Increased incidence by 254%   Patient's current BMI Ideal Body weight  There is no height or weight on file to calculate BMI. Ideal body weight: 59.3 kg (130 lb 11.7 oz) Adjusted ideal body weight: 80.2 kg (176 lb 13.4 oz)   BMI Readings from Last 4 Encounters:  11/30/23 39.71 kg/m  09/28/23 39.71 kg/m  09/22/23 48.43 kg/m  11/07/21 48.43 kg/m   Wt Readings from Last 4 Encounters:  11/30/23 246 lb (111.6 kg)  09/28/23 246 lb (111.6 kg)  09/22/23 (!) 300 lb 0.7 oz (136.1 kg)  11/07/21 (!) 300 lb 0.7 oz (136.1 kg)    Psych/Mental status: Alert, oriented x 3 (person, place, & time)       Eyes: PERLA Respiratory: No evidence of acute respiratory distress  Assessment  Primary Diagnosis & Pertinent Problem List: The primary encounter diagnosis was Chronic pain syndrome. Diagnoses of Pharmacologic therapy, Disorder of skeletal system, and Problems influencing health status were  also pertinent to this visit.  Visit Diagnosis (New problems to examiner): 1. Chronic pain syndrome   2. Pharmacologic  therapy   3. Disorder of skeletal system   4. Problems influencing health status    Plan of Care (Initial workup plan)  Note: Ms. Calixte was reminded that as per protocol, today's visit has been an evaluation only. We have not taken over the patient's controlled substance management.  Problem-specific plan: Assessment and Plan            Lab Orders  No laboratory test(s) ordered today   Imaging Orders  No imaging studies ordered today   Referral Orders  No referral(s) requested today   Procedure Orders    No procedure(s) ordered today   Pharmacotherapy (current): Medications ordered:  No orders of the defined types were placed in this encounter.  Medications administered during this visit: Joye A. Hartshorne had no medications administered during this visit.   Analgesic Pharmacotherapy:  Opioid Analgesics: For patients currently taking or requesting to take opioid analgesics, in accordance with [place]  Medical Board Guidelines, we will assess their risks and indications for the use of these substances. After completing our evaluation, we may offer recommendations, but we no longer take patients for medication management. The prescribing physician will ultimately decide, based on his/her training and level of comfort whether to adopt any of the recommendations, including whether or not to prescribe such medicines.  Membrane stabilizer: To be determined at a later time  Muscle relaxant: To be determined at a later time  NSAID: To be determined at a later time  Other analgesic(s): To be determined at a later time   Interventional management options: Ms. Sui was informed that there is no guarantee that she would be a candidate for interventional therapies. The decision will be based on the results of diagnostic studies, as well as Ms. Mulvey's risk profile.  Procedure(s) under consideration:  Pending results of ordered studies     Interventional Therapies  Risk  Factors  Considerations  Medical Comorbidities:     Planned  Pending:      Under consideration:   Pending   Completed: (Analgesic benefit)1  None at this time   Therapeutic  Palliative (PRN) options:   None established   Completed by other providers:   None reported  1(Analgesic benefit): Expressed in percentage (%). (Local anesthetic[LA] +/- sedation  L.A.Local Anesthetic  Steroid benefit  Ongoing benefit)   Provider-requested follow-up: No follow-ups on file.  Future Appointments  Date Time Provider Department Center  12/15/2023  1:00 PM Tanya Glisson, MD [facility] None  12/28/2023  2:45 PM Clois Fret, MD [facility]   I discussed the assessment and treatment plan with the patient. The patient was provided an opportunity to ask questions and all were answered. The patient agreed with the plan and demonstrated an understanding of the instructions.  Patient advised to call back or seek an in-person evaluation if the symptoms or condition worsens.  Duration of encounter: *** minutes.  Total time on encounter, as per AMA guidelines included both the face-to-face and non-face-to-face time personally spent by the physician and/or other qualified health care professional(s) on the day of the encounter (includes time in activities that require the physician or other qualified health care professional and does not include time in activities normally performed by clinical staff). Physician's time may include the following activities when performed: Preparing to see the patient (e.g., pre-charting review of records, searching for previously ordered imaging,  lab work, and nerve conduction tests) Review of prior analgesic pharmacotherapies. Reviewing PMP Interpreting ordered tests (e.g., lab work, imaging, nerve conduction tests) Performing post-procedure evaluations, including interpretation of diagnostic procedures Obtaining and/or reviewing separately obtained  history Performing a medically appropriate examination and/or evaluation Counseling and educating the patient/family/caregiver Ordering medications, tests, or procedures Referring and communicating with other health care professionals (when not separately reported) Documenting clinical information in the electronic or other health record Independently interpreting results (not separately reported) and communicating results to the patient/ family/caregiver Care coordination (not separately reported)  Note by: Eric DELENA Como, MD (TTS and AI technology used. I apologize for any typographical errors that were not detected and corrected.) Date: 12/15/2023; Time: 8:29 AM

## 2023-12-14 NOTE — Patient Instructions (Incomplete)

## 2023-12-15 ENCOUNTER — Ambulatory Visit: Admitting: Pain Medicine

## 2023-12-15 ENCOUNTER — Ambulatory Visit
Admission: RE | Admit: 2023-12-15 | Discharge: 2023-12-15 | Disposition: A | Discharge status: Home / Self Care | Source: Ambulatory Visit | Attending: Pain Medicine | Admitting: Pain Medicine

## 2023-12-15 ENCOUNTER — Encounter: Payer: Self-pay | Admitting: Pain Medicine

## 2023-12-15 ENCOUNTER — Ambulatory Visit
Admission: RE | Admit: 2023-12-15 | Discharge: 2023-12-15 | Disposition: A | Source: Ambulatory Visit | Attending: Pain Medicine | Admitting: Pain Medicine

## 2023-12-15 VITALS — BP 161/84 | HR 79 | Temp 97.4°F | Ht 66.0 in | Wt 246.0 lb

## 2023-12-15 DIAGNOSIS — G8929 Other chronic pain: Secondary | ICD-10-CM | POA: Insufficient documentation

## 2023-12-15 DIAGNOSIS — M5441 Lumbago with sciatica, right side: Secondary | ICD-10-CM | POA: Insufficient documentation

## 2023-12-15 DIAGNOSIS — G894 Chronic pain syndrome: Secondary | ICD-10-CM | POA: Insufficient documentation

## 2023-12-15 DIAGNOSIS — M545 Low back pain, unspecified: Secondary | ICD-10-CM | POA: Insufficient documentation

## 2023-12-15 DIAGNOSIS — Z79899 Other long term (current) drug therapy: Secondary | ICD-10-CM | POA: Diagnosis not present

## 2023-12-15 DIAGNOSIS — Z789 Other specified health status: Secondary | ICD-10-CM | POA: Diagnosis not present

## 2023-12-15 DIAGNOSIS — R937 Abnormal findings on diagnostic imaging of other parts of musculoskeletal system: Secondary | ICD-10-CM | POA: Insufficient documentation

## 2023-12-15 DIAGNOSIS — M47816 Spondylosis without myelopathy or radiculopathy, lumbar region: Secondary | ICD-10-CM | POA: Diagnosis not present

## 2023-12-15 DIAGNOSIS — M899 Disorder of bone, unspecified: Secondary | ICD-10-CM | POA: Diagnosis not present

## 2023-12-15 DIAGNOSIS — M79604 Pain in right leg: Secondary | ICD-10-CM

## 2023-12-15 NOTE — Progress Notes (Signed)
 Safety precautions to be maintained throughout the outpatient stay will include: orient to surroundings, keep bed in low position, maintain call bell within reach at all times, provide assistance with transfer out of bed and ambulation.

## 2023-12-19 LAB — COMPLIANCE DRUG ANALYSIS, UR

## 2023-12-23 ENCOUNTER — Ambulatory Visit: Admitting: Pain Medicine

## 2023-12-23 ENCOUNTER — Ambulatory Visit
Admission: RE | Admit: 2023-12-23 | Discharge: 2023-12-23 | Disposition: A | Discharge status: Home / Self Care | Source: Ambulatory Visit | Attending: Pain Medicine | Admitting: Pain Medicine

## 2023-12-23 ENCOUNTER — Encounter: Payer: Self-pay | Admitting: Pain Medicine

## 2023-12-23 VITALS — BP 120/65 | HR 64 | Temp 98.1°F | Resp 18 | Ht 66.0 in | Wt 245.0 lb

## 2023-12-23 DIAGNOSIS — M5416 Radiculopathy, lumbar region: Secondary | ICD-10-CM | POA: Diagnosis present

## 2023-12-23 DIAGNOSIS — M79604 Pain in right leg: Secondary | ICD-10-CM | POA: Diagnosis present

## 2023-12-23 DIAGNOSIS — R937 Abnormal findings on diagnostic imaging of other parts of musculoskeletal system: Secondary | ICD-10-CM | POA: Insufficient documentation

## 2023-12-23 DIAGNOSIS — G894 Chronic pain syndrome: Secondary | ICD-10-CM | POA: Insufficient documentation

## 2023-12-23 DIAGNOSIS — M545 Low back pain, unspecified: Secondary | ICD-10-CM | POA: Insufficient documentation

## 2023-12-23 DIAGNOSIS — M21371 Foot drop, right foot: Secondary | ICD-10-CM | POA: Insufficient documentation

## 2023-12-23 DIAGNOSIS — E66813 Obesity, class 3: Secondary | ICD-10-CM

## 2023-12-23 DIAGNOSIS — G8929 Other chronic pain: Secondary | ICD-10-CM | POA: Diagnosis present

## 2023-12-23 DIAGNOSIS — M5441 Lumbago with sciatica, right side: Secondary | ICD-10-CM | POA: Diagnosis present

## 2023-12-23 MED ORDER — FENTANYL CITRATE (PF) 100 MCG/2ML IJ SOLN
25.0000 ug | INTRAMUSCULAR | Status: DC | PRN
  Administered 2023-12-23: 50 ug via INTRAVENOUS

## 2023-12-23 MED ORDER — SODIUM CHLORIDE 0.9% FLUSH
2.0000 mL | Freq: Once | INTRAVENOUS | Status: AC
  Administered 2023-12-23: 2 mL

## 2023-12-23 MED ORDER — IOHEXOL 180 MG/ML  SOLN
10.0000 mL | Freq: Once | INTRAMUSCULAR | Status: AC
  Administered 2023-12-23: 10 mL via EPIDURAL

## 2023-12-23 MED ORDER — MIDAZOLAM HCL 5 MG/5ML IJ SOLN
INTRAMUSCULAR | Status: AC
Start: 2023-12-23 — End: 2023-12-23
  Filled 2023-12-23: qty 5

## 2023-12-23 MED ORDER — SODIUM CHLORIDE (PF) 0.9 % IJ SOLN
INTRAMUSCULAR | Status: AC
  Filled 2023-12-23: qty 10

## 2023-12-23 MED ORDER — ROPIVACAINE HCL 2 MG/ML IJ SOLN
INTRAMUSCULAR | Status: AC
  Filled 2023-12-23: qty 20

## 2023-12-23 MED ORDER — TRIAMCINOLONE ACETONIDE 40 MG/ML IJ SUSP
INTRAMUSCULAR | Status: AC
  Filled 2023-12-23: qty 1

## 2023-12-23 MED ORDER — FENTANYL CITRATE (PF) 100 MCG/2ML IJ SOLN
INTRAMUSCULAR | Status: AC
  Filled 2023-12-23: qty 2

## 2023-12-23 MED ORDER — IOHEXOL 180 MG/ML  SOLN
INTRAMUSCULAR | Status: AC
  Filled 2023-12-23: qty 10

## 2023-12-23 MED ORDER — MIDAZOLAM HCL 5 MG/5ML IJ SOLN
0.5000 mg | Freq: Once | INTRAMUSCULAR | Status: AC
  Administered 2023-12-23: 2 mg via INTRAVENOUS
  Administered 2023-12-23: 1 mg via INTRAVENOUS

## 2023-12-23 MED ORDER — TRIAMCINOLONE ACETONIDE 40 MG/ML IJ SUSP
40.0000 mg | Freq: Once | INTRAMUSCULAR | Status: AC
  Administered 2023-12-23: 40 mg

## 2023-12-23 MED ORDER — LIDOCAINE HCL (PF) 2 % IJ SOLN
INTRAMUSCULAR | Status: AC
  Filled 2023-12-23: qty 10

## 2023-12-23 MED ORDER — ROPIVACAINE HCL 2 MG/ML IJ SOLN
2.0000 mL | Freq: Once | INTRAMUSCULAR | Status: AC
  Administered 2023-12-23: 2 mL via EPIDURAL

## 2023-12-23 MED ORDER — LIDOCAINE HCL 2 % IJ SOLN
20.0000 mL | Freq: Once | INTRAMUSCULAR | Status: AC
  Administered 2023-12-23: 400 mg

## 2023-12-23 MED ORDER — PENTAFLUOROPROP-TETRAFLUOROETH EX AERO
INHALATION_SPRAY | Freq: Once | CUTANEOUS | Status: AC
  Administered 2023-12-23: 30 via TOPICAL

## 2023-12-23 NOTE — Patient Instructions (Signed)
 ______________________________________________________________________    Post-Procedure Discharge Instructions  INSTRUCTIONS Apply ice:  Purpose: This will minimize any swelling and discomfort after procedure.  When: Day of procedure, as soon as you get home. How: Fill a plastic sandwich bag with crushed ice. Cover it with a small towel and apply to injection site. How long: (15 min on, 15 min off) Apply for 15 minutes then remove x 15 minutes.  Repeat sequence on day of procedure, until you go to bed. Apply heat:  Purpose: To treat any soreness and discomfort from the procedure. When: Starting the next day after the procedure. How: Apply heat to procedure site starting the day following the procedure. How long: May continue to repeat daily, until discomfort goes away. Food intake: Start with clear liquids (like water) and advance to regular food, as tolerated.  Physical activities: Keep activities to a minimum for the first 8 hours after the procedure. After that, then as tolerated. Driving: If you have received any sedation, be responsible and do not drive. You are not allowed to drive for 24 hours after having sedation. Blood thinner: (Applies only to those taking blood thinners) You may restart your blood thinner 6 hours after your procedure. Insulin: (Applies only to Diabetic patients taking insulin) As soon as you can eat, you may resume your normal dosing schedule. Infection prevention: Keep procedure site clean and dry. Shower daily and clean area with soap and water.  PAIN DIARY Post-procedure Pain Diary: Extremely important that this be done correctly and accurately. Recorded information will be used to determine the next step in treatment. For the purpose of accuracy, follow these rules: Evaluate only the area treated. Do not report or include pain from an untreated area. For the purpose of this evaluation, ignore all other areas of pain, except for the treated area. After your  procedure, avoid taking a long nap and attempting to complete the pain diary after you wake up. Instead, set your alarm clock to go off every hour, on the hour, for the initial 8 hours after the procedure. Document the duration of the numbing medicine, and the relief you are getting from it. Do not go to sleep and attempt to complete it later. It will not be accurate. If you received sedation, it is likely that you were given a medication that may cause amnesia. Because of this, completing the diary at a later time may cause the information to be inaccurate. This information is needed to plan your care. Follow-up appointment: Keep your post-procedure follow-up evaluation appointment after the procedure (usually 2 weeks for most procedures, 6 weeks for radiofrequencies). DO NOT FORGET to bring you pain diary with you.   EXPECT... (What should I expect to see with my procedure?) From numbing medicine (AKA: Local Anesthetics): Numbness or decrease in pain. You may also experience some weakness, which if present, could last for the duration of the local anesthetic. Onset: Full effect within 15 minutes of injected. Duration: It will depend on the type of local anesthetic used. On the average, 1 to 8 hours.  From steroids (Applies only if steroids were used): Decrease in swelling or inflammation. Once inflammation is improved, relief of the pain will follow. Onset of benefits: Depends on the amount of swelling present. The more swelling, the longer it will take for the benefits to be seen. In some cases, up to 10 days. Duration: Steroids will stay in the system x 2 weeks. Duration of benefits will depend on multiple posibilities including persistent irritating  factors. Side-effects: If present, they may typically last 2 weeks (the duration of the steroids). Frequent: Cramps (if they occur, drink Gatorade and take over-the-counter Magnesium 450-500 mg once to twice a day); water retention with temporary weight  gain; increases in blood sugar; decreased immune system response; increased appetite. Occasional: Facial flushing (red, warm cheeks); mood swings; menstrual changes. Uncommon: Long-term decrease or suppression of natural hormones; bone thinning. (These are more common with higher doses or more frequent use. This is why we prefer that our patients avoid having any injection therapies in other practices.)  Very Rare: Severe mood changes; psychosis; aseptic necrosis. From procedure: Some discomfort is to be expected once the numbing medicine wears off. This should be minimal if ice and heat are applied as instructed.  CALL IF... (When should I call?) You experience numbness and weakness that gets worse with time, as opposed to wearing off. New onset bowel or bladder incontinence. (Applies only to procedures done in the spine)  Emergency Numbers: Durning business hours (Monday - Thursday, 8:00 AM - 4:00 PM) (Friday, 9:00 AM - 12:00 Noon): [phone] After hours: [phone] NOTE: If you are having a problem and are unable connect with, or to talk to a provider, then go to your nearest urgent care or emergency department. If the problem is serious and urgent, please call 911. ______________________________________________________________________     ______________________________________________________________________    Steroid injections  Common steroids for injections Triamcinolone : Used by many sports medicine physicians for large joint and bursal injections, often combined with a local anesthetic like lidocaine . A study focusing on coccydynia (tailbone pain) found triamcinolone  was more effective than betamethasone, suggesting it may also be preferable for other localized inflammation conditions. Methylprednisolone: A common alternative to triamcinolone  that is also a strong anti-inflammatory. It is available in different formulations, with the acetate suspension being the long-acting  option for intra-articular injections. Dexamethasone : This is a non-particulate steroid, meaning it has a lower risk of tissue damage compared to particulate steroids like triamcinolone  and methylprednisolone. While less common for this specific use, it is an option for targeted injections.   Considerations for physicians Particulate vs. non-particulate steroids: Triamcinolone  and methylprednisolone are particulate, meaning they can clump together. Dexamethasone  is non-particulate. Particulate steroids are often preferred for their longer-lasting effects but carry a theoretical higher risk for certain injections (though this is less of a concern in the costochondral joints). Combined injectate: Corticosteroids are typically mixed with a local anesthetic like lidocaine  to provide both immediate pain relief (from the anesthetic) and longer-term inflammation reduction (from the steroid). Imaging guidance: To ensure accurate placement of the needle and medication, physicians may use ultrasound or fluoroscopic guidance for the injection, especially in complex or refractory cases.   Patient guidance Before undergoing a steroid injection, discuss the options with your physician. They will determine the best steroid, dosage, and procedure for your specific case based on factors like: Severity of your condition History of response to other treatments Your overall health status Experience and preference of the physician  Last  Updated: 10/12/2023 ______________________________________________________________________

## 2023-12-23 NOTE — Progress Notes (Signed)
 PROVIDER NOTE: Interpretation of information contained herein should be left to medically-trained personnel. Specific patient instructions are provided elsewhere under Patient Instructions section of medical record. This document was created in part using STT-dictation technology, any transcriptional errors that may result from this process are unintentional.  Patient: Michele Velazquez Type: Established DOB: 2000/03/21 MRN: [id number] PCP: Pcp, No  Service: Procedure DOS: 12/23/2023 Setting: Ambulatory Location: Ambulatory outpatient facility Delivery: Face-to-face Provider: Eric DELENA Como, MD Specialty: Interventional Pain Management Specialty designation: 09 Location: Outpatient facility Ref. Prov.: Como Eric, MD       Interventional Therapy   Type: Lumbar epidural steroid injection (LESI) (interlaminar) #1    Laterality: Right   Level:  L4-5 Level.  Imaging: Fluoroscopic guidance Spinal ([id number]) Anesthesia: Local anesthesia (1-2% Lidocaine ) Anxiolysis: IV Versed 3.0 mg Sedation: Minimal Sedation Fentanyl 1 mL (50 mcg) DOS: 12/23/2023  Performed by: Eric DELENA Como, MD  Purpose: Diagnostic/Therapeutic Indications: Lumbar radicular pain of intraspinal etiology of more than 4 weeks that has failed to respond to conservative therapy and is severe enough to impact quality of life or function. 1. 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 Subacute lumbar radiculopathy (Right)   3. Chronic lower extremity pain (Right)   4. Foot drop (Right)   5. Low back pain of over 3 months duration   6. Low back pain radiating to right leg   7. Lumbar nerve root impingement   8. Abnormal MRI, lumbar spine (09/22/2023)   9. Obesity, Class III, BMI 40-49.9 (morbid obesity) (HCC)    NAS-11 Pain score:   Pre-procedure: 8 /10   Post-procedure: 0-No pain/10   09/22/2023 LUMBAR MRI IMPRESSION: 1. At L5-S1, severe right subarticular recess  stenosis with impingement of the descending right S1 nerve roots. Moderate left subarticular recess stenosis and mild left foraminal stenosis. 2. At L4-5, right far lateral foraminal disc protrusion with likely at least moderate right foraminal stenosis. The exiting/exited right L4 nerve appears inflamed/edematous. Moderate left and mild right (L>R) subarticular recess stenosis. 3. At L3-4, moderate left and mild right (L>R) subarticular recess stenosis. 4. At L2-3, moderate left subarticular recess stenosis.     Position / Prep / Materials:  Position: Prone w/ head of the table raised (slight reverse trendelenburg) to facilitate breathing.  Prep solution: ChloraPrep (2% chlorhexidine gluconate and 70% isopropyl alcohol) Prep Area: Entire Posterior Lumbar Region from lower scapular tip down to mid buttocks area and from flank to flank. Materials:  Tray: Epidural tray Needle(s):  Type: Epidural needle (Tuohy) Gauge (G):  17 Length: Long (20cm) Qty: 1  H&P (Pre-op Assessment):  Michele Velazquez is a 23 y.o. (year old), female patient, seen today for interventional treatment. She  has no past surgical history on file. Michele Velazquez has a current medication list which includes the following prescription(s): ibuprofen , and the following Facility-Administered Medications: fentanyl. Her primarily concern today is the Back Pain (lower)  Initial Vital Signs:  Pulse/HCG Rate: 64ECG Heart Rate: 65 Temp: (!) 97.3 F (36.3 C) Resp: 18 BP: 137/74 SpO2: 100 %  BMI: Estimated body mass index is 39.54 kg/m as calculated from the following:   Height as of this encounter: 5' 6 (1.676 m).   Weight as of this encounter: 245 lb (111.1 kg).  Risk Assessment: Allergies: Reviewed. She is allergic to penicillins, sulfa antibiotics, and bactrim [sulfamethoxazole-trimethoprim].  Allergy Precautions: None required Coagulopathies: Reviewed. None identified.  Blood-thinner therapy: None at this time Active  Infection(s): Reviewed.  None identified. Michele Velazquez is afebrile  Site Confirmation: Michele Velazquez was asked to confirm the procedure and laterality before marking the site Procedure checklist: Completed Consent: Before the procedure and under the influence of no sedative(s), amnesic(s), or anxiolytics, the patient was informed of the treatment options, risks and possible complications. To fulfill our ethical and legal obligations, as recommended by the American Medical Association's Code of Ethics, I have informed the patient of my clinical impression; the nature and purpose of the treatment or procedure; the risks, benefits, and possible complications of the intervention; the alternatives, including doing nothing; the risk(s) and benefit(s) of the alternative treatment(s) or procedure(s); and the risk(s) and benefit(s) of doing nothing. The patient was provided information about the general risks and possible complications associated with the procedure. These may include, but are not limited to: failure to achieve desired goals, infection, bleeding, organ or nerve damage, allergic reactions, paralysis, and death. In addition, the patient was informed of those risks and complications associated to Spine-related procedures, such as failure to decrease pain; infection (i.e.: Meningitis, epidural or intraspinal abscess); bleeding (i.e.: epidural hematoma, subarachnoid hemorrhage, or any other type of intraspinal or peri-dural bleeding); organ or nerve damage (i.e.: Any type of peripheral nerve, nerve root, or spinal cord injury) with subsequent damage to sensory, motor, and/or autonomic systems, resulting in permanent pain, numbness, and/or weakness of one or several areas of the body; allergic reactions; (i.e.: anaphylactic reaction); and/or death. Furthermore, the patient was informed of those risks and complications associated with the medications. These include, but are not limited to: allergic reactions  (i.e.: anaphylactic or anaphylactoid reaction(s)); adrenal axis suppression; blood sugar elevation that in diabetics may result in ketoacidosis or comma; water retention that in patients with history of congestive heart failure may result in shortness of breath, pulmonary edema, and decompensation with resultant heart failure; weight gain; swelling or edema; medication-induced neural toxicity; particulate matter embolism and blood vessel occlusion with resultant organ, and/or nervous system infarction; and/or aseptic necrosis of one or more joints. Finally, the patient was informed that Medicine is not an exact science; therefore, there is also the possibility of unforeseen or unpredictable risks and/or possible complications that may result in a catastrophic outcome. The patient indicated having understood very clearly. We have given the patient no guarantees and we have made no promises. Enough time was given to the patient to ask questions, all of which were answered to the patient's satisfaction. Ms. Boan has indicated that she wanted to continue with the procedure. Attestation: I, the ordering provider, attest that I have discussed with the patient the benefits, risks, side-effects, alternatives, likelihood of achieving goals, and potential problems during recovery for the procedure that I have provided informed consent. Date  Time: 12/23/2023 10:41 AM  Pre-Procedure Preparation:  Monitoring: As per clinic protocol. Respiration, ETCO2, SpO2, BP, heart rate and rhythm monitor placed and checked for adequate function Safety Precautions: Patient was assessed for positional comfort and pressure points before starting the procedure. Time-out: I initiated and conducted the Time-out before starting the procedure, as per protocol. The patient was asked to participate by confirming the accuracy of the Time Out information. Verification of the correct person, site, and procedure were performed and  confirmed by me, the nursing staff, and the patient. Time-out conducted as per Joint Commission's Universal Protocol (UP.01.01.01). Time: 1146 Start Time: 1146 hrs.  Description/Narrative of Procedure:          Target: Epidural space via interlaminar opening, initially targeting the  lower laminar border of the superior vertebral body. Region: Lumbar Approach: Percutaneous paravertebral  Rationale (medical necessity): procedure needed and proper for the diagnosis and/or treatment of the patient's medical symptoms and needs. Procedural Technique Safety Precautions: Aspiration looking for blood return was conducted prior to all injections. At no point did we inject any substances, as a needle was being advanced. No attempts were made at seeking any paresthesias. Safe injection practices and needle disposal techniques used. Medications properly checked for expiration dates. SDV (single dose vial) medications used. Description of the Procedure: Protocol guidelines were followed. The procedure needle was introduced through the skin, ipsilateral to the reported pain, and advanced to the target area. Bone was contacted and the needle walked caudad, until the lamina was cleared. The epidural space was identified using "loss-of-resistance technique" with 2-3 ml of PF-NaCl (0.9% NSS), in a 5cc LOR glass syringe.  Vitals:   12/23/23 1145 12/23/23 1150 12/23/23 1155 12/23/23 1203  BP: (!) 148/84 (!) 149/85 (!) 145/81 (!) 76/60  Pulse:      Resp: 17 16 16 15   Temp:    98.4 F (36.9 C)  TempSrc:    Temporal  SpO2: 100% 100% 100% 99%  Weight:      Height:        Start Time: 1146 hrs. End Time: 1155 hrs.  Imaging Guidance (Spinal):          Type of Imaging Technique: Fluoroscopy Guidance (Spinal) Indication(s): Fluoroscopy guidance for needle placement to enhance accuracy in procedures requiring precise needle localization for targeted delivery of medication in or near specific anatomical locations  not easily accessible without such real-time imaging assistance. Exposure Time: Please see nurses notes. Contrast: Before injecting any contrast, we confirmed that the patient did not have an allergy to iodine, shellfish, or radiological contrast. Once satisfactory needle placement was completed at the desired level, radiological contrast was injected. Contrast injected under live fluoroscopy. No contrast complications. See chart for type and volume of contrast used. Fluoroscopic Guidance: I was personally present during the use of fluoroscopy. Tunnel Vision Technique used to obtain the best possible view of the target area. Parallax error corrected before commencing the procedure. Direction-depth-direction technique used to introduce the needle under continuous pulsed fluoroscopy. Once target was reached, antero-posterior, oblique, and lateral fluoroscopic projection used confirm needle placement in all planes. Images permanently stored in EMR. Interpretation: I personally interpreted the imaging intraoperatively. Adequate needle placement confirmed in multiple planes. Appropriate spread of contrast into desired area was observed. No evidence of afferent or efferent intravascular uptake. No intrathecal or subarachnoid spread observed. Permanent images saved into the patient's record.  Antibiotic Prophylaxis:   Anti-infectives (From admission, onward)    None      Indication(s): None identified  Post-operative Assessment:  Post-procedure Vital Signs:  Pulse/HCG Rate: 6471 Temp: 98.4 F (36.9 C) Resp: 15 BP: (!) 76/60 SpO2: 99 %  EBL: None  Complications: No immediate post-treatment complications observed by team, or reported by patient.  Note: The patient tolerated the entire procedure well. A repeat set of vitals were taken after the procedure and the patient was kept under observation following institutional policy, for this type of procedure. Post-procedural neurological  assessment was performed, showing return to baseline, prior to discharge. The patient was provided with post-procedure discharge instructions, including a section on how to identify potential problems. Should any problems arise concerning this procedure, the patient was given instructions to immediately contact us , at any time, without hesitation. In any case,  we plan to contact the patient by telephone for a follow-up status report regarding this interventional procedure.  Comments:  No additional relevant information.  Plan of Care (POC)  Orders:  Orders Placed This Encounter  Procedures   Lumbar Epidural Injection    Scheduling Instructions:     Procedure: Interlaminar LESI L4-5     Laterality: Right     Sedation: With Sedation     Date: 12/23/2023    Where will this procedure be performed?:   [facility]   [facility] C-ARM 1-60 MIN NO REPORT    Intraoperative interpretation by procedural physician at [facility].    Standing Status:   Standing    Number of Occurrences:   1    Reason for exam::   Assistance in needle guidance and placement for procedures requiring needle placement in or near specific anatomical locations not easily accessible without such assistance.   Informed Consent Details: Physician/Practitioner Attestation; Transcribe to consent form and obtain patient signature    Note: Always confirm laterality of pain with Ms. Amezcua, before procedure. Transcribe to consent form and obtain patient signature.    Physician/Practitioner attestation of informed consent for procedure/surgical case:   I, the physician/practitioner, attest that I have discussed with the patient the benefits, risks, side effects, alternatives, likelihood of achieving goals and potential problems during recovery for the procedure that I have provided informed consent.    Procedure:   Lumbar epidural steroid injection under fluoroscopic guidance    Physician/Practitioner performing  the procedure:   Theodora Lalanne A. Tanya, MD    Indication/Reason:   Low back and/or lower extremity pain secondary to lumbar radiculitis   Provide equipment / supplies at bedside    Procedural tray: Epidural Tray (Disposable  single use) Skin infiltration needle: Regular 1.5-in, 25-G, (x1) Block needle size: Regular standard Catheter: No catheter required    Standing Status:   Standing    Number of Occurrences:   1    Specify:   Epidural Tray   Saline lock IV    Have LR [phone] mL available and administer at 125 mL/hr if patient becomes hypotensive.    Standing Status:   Standing    Number of Occurrences:   1     Opioid Analgesic: None MME/day: 0 mg/day    Medications ordered for procedure: Meds ordered this encounter  Medications   iohexol  (OMNIPAQUE ) 180 MG/ML injection 10 mL    Must be Myelogram-compatible. If not available, you may substitute with a water-soluble, non-ionic, hypoallergenic, myelogram-compatible radiological contrast medium.   lidocaine  (XYLOCAINE ) 2 % (with pres) injection 400 mg   pentafluoroprop-tetrafluoroeth (GEBAUERS) aerosol   midazolam (VERSED) 5 MG/5ML injection 0.5-2 mg    Make sure Flumazenil is available in the pyxis when using this medication. If oversedation occurs, administer 0.2 mg IV over 15 sec. If after 45 sec no response, administer 0.2 mg again over 1 min; may repeat at 1 min intervals; not to exceed 4 doses (1 mg)   fentaNYL (SUBLIMAZE) injection 25-50 mcg    Make sure Narcan is available in the pyxis when using this medication. In the event of respiratory depression (RR< 8/min): Titrate NARCAN (naloxone) in increments of 0.1 to 0.2 mg IV at 2-3 minute intervals, until desired degree of reversal.   sodium chloride  flush (NS) 0.9 % injection 2 mL   ropivacaine (PF) 2 mg/mL (0.2%) (NAROPIN) injection 2 mL   triamcinolone acetonide (KENALOG-40) injection 40 mg   Medications administered: We  administered iohexol , lidocaine ,  pentafluoroprop-tetrafluoroeth, midazolam, fentaNYL, sodium chloride  flush, ropivacaine (PF) 2 mg/mL (0.2%), and triamcinolone acetonide.  See the medical record for exact dosing, route, and time of administration.    Interventional Therapies  Risk Factors  Considerations  Medical Comorbidities:     Planned  Pending:   Diagnostic/therapeutic right L4-5 LESI #1    Under consideration:   Diagnostic/therapeutic right L4-5 LESI #1    Completed: (Analgesic benefit)1  None at this time   Therapeutic  Palliative (PRN) options:   None established   Completed by other providers:   None reported  1(Analgesic benefit): Expressed in percentage (%). (Local anesthetic[LA] +/- sedation  L.A.Local Anesthetic  Steroid benefit  Ongoing benefit)    Follow-up plan:   Return in about 2 weeks (around 01/06/2024) for (Face2F), (PPE).     Recent Visits Date Type Provider Dept  12/15/23 Office Visit Tanya Glisson, MD [facility]  Showing recent visits within past 90 days and meeting all other requirements Today's Visits Date Type Provider Dept  12/23/23 Procedure visit Tanya Glisson, MD [facility]  Showing today's visits and meeting all other requirements Future Appointments No visits were found meeting these conditions. Showing future appointments within next 90 days and meeting all other requirements   Disposition: Discharge home  Discharge (Date  Time): 12/23/2023;   hrs.   Primary Care Physician: Pcp, No Location: [facility] Note by: Glisson DELENA Tanya, MD (TTS technology used. I apologize for any typographical errors that were not detected and corrected.) Date: 12/23/2023; Time: 12:07 PM  Disclaimer:  Medicine is not an visual merchandiser. The only guarantee in medicine is that nothing is guaranteed. It is important to note that the decision to proceed with this intervention was based on the information collected from the  patient. The Data and conclusions were drawn from the patient's questionnaire, the interview, and the physical examination. Because the information was provided in large part by the patient, it cannot be guaranteed that it has not been purposely or unconsciously manipulated. Every effort has been made to obtain as much relevant data as possible for this evaluation. It is important to note that the conclusions that lead to this procedure are derived in large part from the available data. Always take into account that the treatment will also be dependent on availability of resources and existing treatment guidelines, considered by other Pain Management Practitioners as being common knowledge and practice, at the time of the intervention. For Medico-Legal purposes, it is also important to point out that variation in procedural techniques and pharmacological choices are the acceptable norm. The indications, contraindications, technique, and results of the above procedure should only be interpreted and judged by a Board-Certified Interventional Pain Specialist with extensive familiarity and expertise in the same exact procedure and technique.

## 2023-12-24 ENCOUNTER — Telehealth: Payer: Self-pay | Admitting: *Deleted

## 2023-12-24 NOTE — Telephone Encounter (Signed)
 No problems post procedure.

## 2023-12-25 LAB — COMP. METABOLIC PANEL (12)
AST: 15 IU/L (ref 0–40)
Albumin: 4.7 g/dL (ref 4.0–5.0)
Alkaline Phosphatase: 42 IU/L (ref 41–116)
BUN/Creatinine Ratio: 17 (ref 9–23)
BUN: 14 mg/dL (ref 6–20)
Bilirubin Total: 0.4 mg/dL (ref 0.0–1.2)
Calcium: 9.8 mg/dL (ref 8.7–10.2)
Chloride: 104 mmol/L (ref 96–106)
Creatinine, Ser: 0.83 mg/dL (ref 0.57–1.00)
Globulin, Total: 3.2 g/dL (ref 1.5–4.5)
Glucose: 86 mg/dL (ref 70–99)
Potassium: 4.2 mmol/L (ref 3.5–5.2)
Sodium: 143 mmol/L (ref 134–144)
Total Protein: 7.9 g/dL (ref 6.0–8.5)
eGFR: 102 mL/min/1.73 (ref >59)

## 2023-12-25 LAB — C-REACTIVE PROTEIN: CRP: 10 mg/L (ref 0–10)

## 2023-12-25 LAB — 25-HYDROXY VITAMIN D LCMS D2+D3
25-Hydroxy, Vitamin D-2: <1 ng/mL
25-Hydroxy, Vitamin D-3: 15 ng/mL
25-Hydroxy, Vitamin D: 16 ng/mL — ABNORMAL LOW

## 2023-12-25 LAB — SEDIMENTATION RATE: Sed Rate: 35 mm/h — ABNORMAL HIGH (ref 0–32)

## 2023-12-25 LAB — VITAMIN B12: Vitamin B-12: 393 pg/mL (ref 232–1245)

## 2023-12-25 LAB — MAGNESIUM: Magnesium: 2.1 mg/dL (ref 1.6–2.3)

## 2023-12-28 ENCOUNTER — Ambulatory Visit: Admitting: Neurosurgery

## 2024-01-16 NOTE — Progress Notes (Unsigned)
 PROVIDER NOTE: Interpretation of information contained herein should be left to medically-trained personnel. Specific patient instructions are provided elsewhere under Patient Instructions section of medical record. This document was created in part using AI and STT-dictation technology, any transcriptional errors that may result from this process are unintentional.  Patient: Michele Velazquez  Service: E/M   PCP: Pcp, No  DOB: [birth year]  DOS: 01/17/2024  Provider: Eric DELENA Como, MD  MRN: [id number]  Delivery: Face-to-face  Specialty: Interventional Pain Management  Type: Established Patient  Setting: Ambulatory outpatient facility  Specialty designation: 09  Referring Prov.: No ref. provider found  Location: Outpatient office facility       History of present illness (HPI) [name], a 23 y.o. year old female, is here today because of her Chronic pain of right lower extremity [M79.604, G89.29]. [name] primary complain today is No chief complaint on file.  Pertinent problems: 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  Pain Assessment: Severity of   is reported as a  /10. Location:    / . Onset:  . Quality:  . Timing:  . Modifying factor(s):  Michele Velazquez Vitals:  vitals were not taken for this visit.  BMI: Estimated body mass index is 39.54 kg/m as calculated from the following:   Height as of 12/23/23: 5' 6 (1.676 m).   Weight as of 12/23/23: 245 lb (111.1 kg).  Last encounter: 12/15/2023. Last procedure: 12/23/2023.  Reason for encounter: post-procedure evaluation and assessment.   Discussed the use of AI scribe software for  clinical note transcription with the patient, who gave verbal consent to proceed.  History of Present Illness          Post-Procedure Evaluation   Type: Lumbar epidural steroid injection (LESI) (interlaminar) #1    Laterality: Right   Level:  L4-5 Level.  Imaging: Fluoroscopic guidance Spinal ([id number]) Anesthesia: Local anesthesia (1-2% Lidocaine ) Anxiolysis: IV Versed  3.0 mg Sedation: Minimal Sedation Fentanyl  1 mL (50 mcg) DOS: 12/23/2023  Performed by: Eric DELENA Como, MD  Purpose: Diagnostic/Therapeutic Indications: Lumbar radicular pain of intraspinal etiology of more than 4 weeks that has failed to respond to conservative therapy and is severe enough to impact quality of life or function. 1. 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 Subacute lumbar radiculopathy (Right)   3. Chronic lower extremity pain (Right)   4. Foot drop (Right)   5. Low back pain of over 3 months duration   6. Low back pain radiating to right leg   7. Lumbar nerve root impingement   8. Abnormal MRI, lumbar spine (09/22/2023)   9. Obesity, Class III, BMI 40-49.9 (morbid obesity) (HCC)    NAS-11 Pain score:   Pre-procedure: 8 /10   Post-procedure: 0-No pain/10   09/22/2023 LUMBAR MRI IMPRESSION: 1. At L5-S1, severe right subarticular recess stenosis with impingement of the descending right S1 nerve roots. Moderate left subarticular recess stenosis and mild left foraminal stenosis. 2. At L4-5, right far lateral foraminal disc protrusion with likely at least moderate right foraminal stenosis. The exiting/exited right L4 nerve appears inflamed/edematous. Moderate left and mild right (L>R) subarticular recess stenosis.  3. At L3-4, moderate left and mild right (L>R) subarticular recess stenosis. 4. At L2-3, moderate left subarticular recess stenosis.    Effectiveness:  Initial hour after procedure:   ***. Subsequent 4-6 hours post-procedure:   ***. Analgesia  past initial 6 hours:   ***. Ongoing improvement:  Analgesic:  *** Function:    ***    ROM:    ***    Interpretation: ***  Pharmacotherapy Assessment   Opioid Analgesic: None MME/day: 0 mg/day   Monitoring: [place] PMP: PDMP reviewed during this encounter.       Pharmacotherapy: No side-effects or adverse reactions reported. Compliance: No problems identified. Effectiveness: Clinically acceptable.  No notes on file  UDS:  Summary  Date Value Ref Range Status  12/15/2023 FINAL  Final    Comment:    ==================================================================== Compliance Drug Analysis, Ur ==================================================================== Test                             Result       Flag       Units  Drug Present and Declared for Prescription Verification   Ibuprofen                       PRESENT      EXPECTED  Drug Present not Declared for Prescription Verification   Carboxy-THC                    493          UNEXPECTED ng/mg creat    Carboxy-THC is a metabolite of tetrahydrocannabinol (THC). Source of    THC is most commonly herbal marijuana or marijuana-based products,    but THC is also present in a scheduled prescription medication.    Trace amounts of THC can be present in hemp and cannabidiol (CBD)    products. This test is not intended to distinguish between delta-9-    tetrahydrocannabinol, the predominant form of THC in most herbal or    marijuana-based products, and delta-8-tetrahydrocannabinol.  ==================================================================== Test                      Result    Flag   Units      Ref Range   Creatinine              122              mg/dL      >=79 ==================================================================== Declared Medications:  The flagging and interpretation on this report are based on the  following declared medications.  Unexpected results may arise from  inaccuracies in the declared  medications.   **Note: The testing scope of this panel does not include small to  moderate amounts of these reported medications:   Ibuprofen  (Advil ) ==================================================================== For clinical consultation, please call [phone]. ====================================================================     No results found for: CBDTHCR No results found for: D8THCCBX No results found for: D9THCCBX  ROS  Constitutional: Denies any fever or chills Gastrointestinal: No reported hemesis, hematochezia, vomiting, or acute GI distress Musculoskeletal: Denies any acute onset joint swelling, redness, loss of ROM, or weakness Neurological: No reported episodes of acute onset apraxia, aphasia, dysarthria, agnosia, amnesia, paralysis, loss of coordination, or loss of consciousness  Medication Review  ibuprofen   History Review  Allergy: [name] is allergic to penicillins, sulfa antibiotics, and bactrim [sulfamethoxazole-trimethoprim]. Drug: [name]  has no history on file  for drug use. Alcohol:  reports no history of alcohol use. Tobacco:  reports that she has never smoked. She has never used smokeless tobacco. Social: [name]  reports that she has never smoked. She has never used smokeless tobacco. She reports that she does not drink alcohol. Medical:  has no past medical history on file. Surgical: [name]  has no past surgical history on file. Family: family history is not on file.  Laboratory Chemistry Profile   Renal Lab Results  Component Value Date   BUN 14 12/15/2023   CREATININE 0.83 12/15/2023   BCR 17 12/15/2023   GFRNONAA >60 11/08/2021    Hepatic Lab Results  Component Value Date   AST 15 12/15/2023   ALBUMIN 4.7 12/15/2023   ALKPHOS 42 12/15/2023    Electrolytes Lab Results  Component Value Date   NA 143 12/15/2023   K 4.2 12/15/2023   CL 104 12/15/2023   CALCIUM 9.8 12/15/2023   MG 2.1 12/15/2023     Bone Lab Results  Component Value Date   25OHVITD1 16 (L) 12/15/2023   25OHVITD2 <1.0 12/15/2023   25OHVITD3 15 12/15/2023    Inflammation (CRP: Acute Phase) (ESR: Chronic Phase) Lab Results  Component Value Date   CRP 10 12/15/2023   ESRSEDRATE 35 (H) 12/15/2023         Note: Above Lab results reviewed.  Recent Imaging Review  DG PAIN CLINIC C-ARM 1-60 MIN NO REPORT Fluoro was used, but no Radiologist interpretation will be provided.  Please refer to NOTES tab for provider progress note. Note: Reviewed        Physical Exam  Vitals: There were no vitals taken for this visit. BMI: Estimated body mass index is 39.54 kg/m as calculated from the following:   Height as of 12/23/23: 5' 6 (1.676 m).   Weight as of 12/23/23: 245 lb (111.1 kg). Ideal: Patient weight not recorded General appearance: Well nourished, well developed, and well hydrated. In no apparent acute distress Mental status: Alert, oriented x 3 (person, place, & time)       Respiratory: No evidence of acute respiratory distress Eyes: PERLA   Assessment   Diagnosis Status  1. Chronic lower extremity pain (Right)   2. Foot drop (Right)   3. Low back pain radiating to right leg   4. 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 Subacute lumbar radiculopathy (Right)   6. Low back pain of over 3 months duration   7. Postop check    Controlled Controlled Controlled   Updated Problems: No problems updated.  Plan of Care  Problem-specific:  Assessment and Plan            [name] is not on any long-term medications.  Pharmacotherapy (Medications Ordered): No orders of the defined types were placed in this encounter.  Orders:  No orders of the defined types were placed in this encounter.    Interventional Therapies  Risk Factors  Considerations  Medical Comorbidities:     Planned  Pending:   Diagnostic/therapeutic right L4-5 LESI #1    Under  consideration:   Diagnostic/therapeutic right L4-5 LESI #1    Completed: (Analgesic benefit)1  None at this time   Therapeutic  Palliative (PRN) options:   None established   Completed by other providers:   None reported  1(Analgesic benefit): Expressed in percentage (%). (Local anesthetic[LA] +/- sedation  L.A.Local Anesthetic  Steroid benefit  Ongoing benefit)  No follow-ups on file.    Recent Visits Date Type Provider Dept  12/23/23 Procedure visit Tanya Glisson, MD [facility]  12/15/23 Office Visit Tanya Glisson, MD [facility]  Showing recent visits within past 90 days and meeting all other requirements Future Appointments Date Type Provider Dept  01/17/24 Appointment Tanya Glisson, MD [facility]  Showing future appointments within next 90 days and meeting all other requirements  I discussed the assessment and treatment plan with the patient. The patient was provided an opportunity to ask questions and all were answered. The patient agreed with the plan and demonstrated an understanding of the instructions.  Patient advised to call back or seek an in-person evaluation if the symptoms or condition worsens.  Duration of encounter: *** minutes.  Total time on encounter, as per AMA guidelines included both the face-to-face and non-face-to-face time personally spent by the physician and/or other qualified health care professional(s) on the day of the encounter (includes time in activities that require the physician or other qualified health care professional and does not include time in activities normally performed by clinical staff). Physician's time may include the following activities when performed: Preparing to see the patient (e.g., pre-charting review of records, searching for previously ordered imaging, lab work, and nerve conduction tests) Review of prior analgesic pharmacotherapies. Reviewing PMP Interpreting ordered  tests (e.g., lab work, imaging, nerve conduction tests) Performing post-procedure evaluations, including interpretation of diagnostic procedures Obtaining and/or reviewing separately obtained history Performing a medically appropriate examination and/or evaluation Counseling and educating the patient/family/caregiver Ordering medications, tests, or procedures Referring and communicating with other health care professionals (when not separately reported) Documenting clinical information in the electronic or other health record Independently interpreting results (not separately reported) and communicating results to the patient/ family/caregiver Care coordination (not separately reported)  Note by: Glisson DELENA Tanya, MD (TTS and AI technology used. I apologize for any typographical errors that were not detected and corrected.) Date: 01/17/2024; Time: 10:43 AM

## 2024-01-17 ENCOUNTER — Ambulatory Visit: Admitting: Pain Medicine

## 2024-01-17 DIAGNOSIS — M79604 Pain in right leg: Secondary | ICD-10-CM

## 2024-01-17 DIAGNOSIS — M5416 Radiculopathy, lumbar region: Secondary | ICD-10-CM

## 2024-01-17 DIAGNOSIS — M21371 Foot drop, right foot: Secondary | ICD-10-CM

## 2024-01-17 DIAGNOSIS — Z09 Encounter for follow-up examination after completed treatment for conditions other than malignant neoplasm: Secondary | ICD-10-CM

## 2024-01-17 DIAGNOSIS — M545 Low back pain, unspecified: Secondary | ICD-10-CM

## 2024-01-17 DIAGNOSIS — Z91199 Patient's noncompliance with other medical treatment and regimen due to unspecified reason: Secondary | ICD-10-CM

## 2024-01-17 DIAGNOSIS — G8929 Other chronic pain: Secondary | ICD-10-CM

## 2024-01-17 NOTE — Patient Instructions (Signed)

## 2024-01-25 NOTE — Progress Notes (Unsigned)
 PROVIDER NOTE: Interpretation of information contained herein should be left to medically-trained personnel. Specific patient instructions are provided elsewhere under Patient Instructions section of medical record. This document was created in part using AI and STT-dictation technology, any transcriptional errors that may result from this process are unintentional.  Patient: Michele Velazquez  Service: E/M   PCP: Alyse Bradley, MD  DOB: 02/03/1961  DOS: 01/26/2024  Provider: Eric DELENA Como, MD  MRN: [id number]  Delivery: Face-to-face  Specialty: Interventional Pain Management  Type: Established Patient  Setting: Ambulatory outpatient facility  Specialty designation: 09  Referring Prov.: Alyse Bradley, MD  Location: Outpatient office facility       History of present illness (HPI) Michele Velazquez, a 22 y.o. year old female, is here today because of her Chronic bilateral low back pain without sciatica [M54.50, G89.29]. Michele Velazquez primary complain today is No chief complaint on file.  Pertinent problems: Michele Velazquez has Cervical spinal cord compression [facility]) (April, 2017); Cervical spinal stenosis; Chronic shoulder pain (1ry area of Pain) (Bilateral) (R>L); Chronic low back pain (3ry area of Pain) (Bilateral) (R>L) w/ sciatica (Right); Lumbar facet syndrome (Bilateral) (R>L); Lumbar spondylosis; Failed back surgical syndrome; Chronic lower extremity pain (Right); Chronic neck pain (Bilateral) (R>L); Chronic cervical radicular pain (Right); Ulnar neuropathy (Left); Hx of cervical spine surgery; Cervical spondylosis; Fibromyalgia; Chronic sacroiliac pain (Bilateral) (L>R); History of total arthroplasty of shoulder (Left); Chronic pain syndrome; Complaints of weakness of lower extremity; Anterolisthesis of lumbar spine (L4/L5); Cervical facet syndrome (HCC); Chronic musculoskeletal pain; Muscle spasm of back; Myofascial pain; Trigger point with back pain; Osteoarthritis of shoulder (Bilateral);  Spondylosis without myelopathy or radiculopathy, lumbosacral region; Cellulitis of leg, right; Chronic trochanteric bursitis (Bilateral); Osteoarthritis involving multiple joints; Chronic shoulder pain after replacement; History of total shoulder replacement (Right); History of total replacement of shoulder joints (Bilateral); Leg edema, left; Spondylosis without myelopathy or radiculopathy, cervical region; Chronic hip pain (Right); Unilateral primary osteoarthritis, right hip; Chronic sacroiliac joint pain (Left); Other spondylosis, sacral and sacrococcygeal region; Sacroiliac joint dysfunction (Left); Somatic dysfunction of sacroiliac joint (Left); Enthesopathy of sacroiliac joint (Left); DDD (degenerative disc disease), cervical; DDD (degenerative disc disease), lumbosacral; Other intervertebral disc degeneration, lumbar region; Acetabular labrum tear, sequela (Left); Acetabular labrum tear, sequela (Right); Epidural fibrosis (Left: L5-S1); Chronic groin pain (Bilateral); Abnormal MRI, lumbar spine (05/02/2020); Abnormal MRI, hip (Bilateral) (05/08/2020); Chronic shoulder pain (Left); Cervicalgia; Chronic neck pain with history of cervical spinal surgery; Chronic hip pain (Left); Chronic groin pain (Left); Abnormal drug screen (06/25/2021); Chronic sacroiliac joint pain (Right); Chronic pain disorder; Arthralgia of hip; Trochanteric bursitis of hip (Right); Left thigh pain; Right buttock pain; Somatic dysfunction of sacroiliac joint (Right); Sacroiliac joint dysfunction (Right); Chronic low back pain (Bilateral) (R>L) w/o sciatica; Lumbar facet joint pain; Osteoarthritis of facet joint of lumbar spine; Abnormal CT scan, cervical spine (02/19/2021); Osteoarthritis of hip (Left); Status post total replacement of hip (Right); History of total hip replacement (Right); Acute pain of right wrist; Chronic wrist pain (Right); Arthritis of carpometacarpal [facility]) joint of thumb (Right); and History of thrush on their  pertinent problem list.  Pain Assessment: Severity of   is reported as a  /10. Location:    / . Onset:  . Quality:  . Timing:  . Modifying factor(s):  SABRA Vitals:  vitals were not taken for this visit.  BMI: Estimated body mass index is 38.94 kg/m as calculated from the following:   Height as of 12/16/23: 5' 5 (1.651 m).  recess stenosis. 3. At L3-4, moderate left and mild right (L>R) subarticular recess stenosis. 4. At L2-3, moderate left subarticular recess stenosis.    Effectiveness:  Initial hour after procedure:   ***. Subsequent 4-6 hours post-procedure:    ***. Analgesia past initial 6 hours:   ***. Ongoing improvement:  Analgesic:  *** Function:    ***    ROM:    ***    Interpretation: ***  Pharmacotherapy Assessment   Opioid Analgesic: None MME/day: 0 mg/day   Monitoring: [place] PMP: PDMP reviewed during this encounter.       Pharmacotherapy: No side-effects or adverse reactions reported. Compliance: No problems identified. Effectiveness: Clinically acceptable.  No notes on file  UDS:  Summary  Date Value Ref Range Status  12/15/2023 FINAL  Final    Comment:    ==================================================================== Compliance Drug Analysis, Ur ==================================================================== Test                             Result       Flag       Units  Drug Present and Declared for Prescription Verification   Ibuprofen                       PRESENT      EXPECTED  Drug Present not Declared for Prescription Verification   Carboxy-THC                    493          UNEXPECTED ng/mg creat    Carboxy-THC is a metabolite of tetrahydrocannabinol (THC). Source of    THC is most commonly herbal marijuana or marijuana-based products,    but THC is also present in a scheduled prescription medication.    Trace amounts of THC can be present in hemp and cannabidiol (CBD)    products. This test is not intended to distinguish between delta-9-    tetrahydrocannabinol, the predominant form of THC in most herbal or    marijuana-based products, and delta-8-tetrahydrocannabinol.  ==================================================================== Test                      Result    Flag   Units      Ref Range   Creatinine              122              mg/dL      >=79 ==================================================================== Declared Medications:  The flagging and interpretation on this report are based on the  following declared medications.  Unexpected results may arise from  inaccuracies in  the declared medications.   **Note: The testing scope of this panel does not include small to  moderate amounts of these reported medications:   Ibuprofen  (Advil ) ==================================================================== For clinical consultation, please call [phone]. ====================================================================     No results found for: CBDTHCR No results found for: D8THCCBX No results found for: D9THCCBX  ROS  Constitutional: Denies any fever or chills Gastrointestinal: No reported hemesis, hematochezia, vomiting, or acute GI distress Musculoskeletal: Denies any acute onset joint swelling, redness, loss of ROM, or weakness Neurological: No reported episodes of acute onset apraxia, aphasia, dysarthria, agnosia, amnesia, paralysis, loss of coordination, or loss of consciousness  Medication Review  ibuprofen   History Review  Allergy: Michele Velazquez is allergic to penicillins, sulfa antibiotics, and bactrim [sulfamethoxazole-trimethoprim]. Drug: Michele Velazquez  has no history  2433        EXPECTED   ng/mg creat   Noroxymorphone                 58           EXPECTED   ng/mg creat    Sources of oxycodone  are scheduled prescription medications.    Oxymorphone, noroxycodone, and noroxymorphone are expected    metabolites of oxycodone . Oxymorphone is also available as a    scheduled prescription medication.  Drug Present  not Declared for Prescription Verification   Methamphetamine                12           UNEXPECTED ng/mg creat    Sources of methamphetamine include illicit sources, as a scheduled    prescription medication, as a metabolite of some prescription drugs,    or use of an l-methamphetamine inhaler.    Oxazepam                       17           UNEXPECTED ng/mg creat    Oxazepam may be administered as a scheduled prescription medication;    it is also an expected metabolite of other benzodiazepine drugs,    including diazepam , chlordiazepoxide, prazepam, clorazepate,    halazepam, and temazepam.    Benzoylecgonine                38           UNEXPECTED ng/mg creat    Benzoylecgonine is a metabolite of cocaine; its presence indicates    use of this drug.  Source is most commonly illicit, but cocaine is    present in some topical anesthetic solutions.  ==================================================================== Test                      Result    Flag   Units      Ref Range   Creatinine              411              mg/dL      >=79 ==================================================================== Declared Medications:  The flagging and interpretation on this report are based on the  following declared medications.  Unexpected results may arise from  inaccuracies in the declared medications.   **Note: The testing scope of this panel includes these medications:   Oxycodone    **Note: The testing scope of this panel does not include the  following reported medications:   Albuterol   Azelastine  Cetirizine  Citalopram  (Celexa )  Diclofenac  (Voltaren )  Docusate (Colace)  Fluticasone  (Trelegy)  Guaifenesin  (Mucinex )  Levothyroxine  (Synthroid )  Naloxone   Nystatin  Supplement  Tizanidine  (Zanaflex )  Umeclidinium (Trelegy)  Vilanterol (Trelegy)  Vitamin D  ==================================================================== For clinical consultation, please call [phone]. ====================================================================     No results found for: CBDTHCR No results found for: D8THCCBX No results found for: D9THCCBX  ROS  Constitutional: Denies any fever or chills Gastrointestinal: No reported hemesis, hematochezia, vomiting, or acute GI distress Musculoskeletal: Denies any acute onset joint swelling, redness, loss of ROM, or weakness Neurological: No reported episodes of acute onset apraxia, aphasia, dysarthria, agnosia, amnesia, paralysis, loss of coordination, or loss of consciousness  Medication Review  Menthol  (Topical Analgesic), Tiotropium Bromide , Vitamin D3, albuterol , aspirin , budesonide-formoterol, cetirizine, citalopram , cyclobenzaprine , dextromethorphan-guaiFENesin , docusate sodium , fluconazole , fluticasone , ipratropium-albuterol , levothyroxine , losartan , and oxyCODONE   History  Ongoing benefit)   No follow-ups on file.    Recent Visits Date Type Provider Dept  12/23/23 Procedure visit Tanya Glisson, MD [facility]  12/15/23 Office Visit Tanya Glisson, MD [facility]  Showing recent visits within past 90 days and meeting all other requirements Future Appointments Date Type Provider Dept  01/26/24 Appointment Tanya Glisson, MD [facility]  Showing future appointments within next 90 days and meeting all other requirements  I discussed the assessment and treatment plan with the patient. The patient was provided an opportunity to ask questions and all were answered. The patient agreed with the plan and demonstrated an understanding of the instructions.  Patient advised to call back or seek an in-person evaluation if the symptoms or condition worsens.  Duration of encounter: *** minutes.  Total time on encounter, as per AMA guidelines included both the face-to-face and non-face-to-face time personally spent by the physician and/or other qualified health care professional(s) on the day of the encounter (includes time in activities that require the physician or other qualified health care professional and does not include time in activities normally performed by clinical staff). Physician's time may include the following activities when performed: Preparing to see the patient (e.g., pre-charting review of records, searching for previously ordered imaging, lab work, and nerve conduction tests) Review of prior analgesic pharmacotherapies. Reviewing PMP Interpreting  ordered tests (e.g., lab work, imaging, nerve conduction tests) Performing post-procedure evaluations, including interpretation of diagnostic procedures Obtaining and/or reviewing separately obtained history Performing a medically appropriate examination and/or evaluation Counseling and educating the patient/family/caregiver Ordering medications, tests, or procedures Referring and communicating with other health care professionals (when not separately reported) Documenting clinical information in the electronic or other health record Independently interpreting results (not separately reported) and communicating results to the patient/ family/caregiver Care coordination (not separately reported)  Note by: Glisson DELENA Tanya, MD (TTS and AI technology used. I apologize for any typographical errors that were not detected and corrected.) Date: 01/26/2024; Time: 6:10 PM

## 2024-01-26 ENCOUNTER — Ambulatory Visit: Admitting: Pain Medicine

## 2024-01-26 DIAGNOSIS — M545 Low back pain, unspecified: Secondary | ICD-10-CM

## 2024-01-26 DIAGNOSIS — Z09 Encounter for follow-up examination after completed treatment for conditions other than malignant neoplasm: Secondary | ICD-10-CM

## 2024-01-26 DIAGNOSIS — Z91199 Patient's noncompliance with other medical treatment and regimen due to unspecified reason: Secondary | ICD-10-CM

## 2024-01-26 DIAGNOSIS — M21371 Foot drop, right foot: Secondary | ICD-10-CM

## 2024-01-26 DIAGNOSIS — G8929 Other chronic pain: Secondary | ICD-10-CM

## 2024-01-26 DIAGNOSIS — M5416 Radiculopathy, lumbar region: Secondary | ICD-10-CM

## 2024-01-26 NOTE — Patient Instructions (Signed)

## 2024-01-30 NOTE — Progress Notes (Unsigned)
 PROVIDER NOTE: Interpretation of information contained herein should be left to medically-trained personnel. Specific patient instructions are provided elsewhere under Patient Instructions section of medical record. This document was created in part using AI and STT-dictation technology, any transcriptional errors that may result from this process are unintentional.  Patient: Michele Velazquez  Service: E/M   PCP: Pcp, No  DOB: [birth year]  DOS: 01/31/2024  Provider: Eric DELENA Como, MD  MRN: [id number]  Delivery: Face-to-face  Specialty: Interventional Pain Management  Type: Established Patient  Setting: Ambulatory outpatient facility  Specialty designation: 09  Referring Prov.: No ref. provider found  Location: Outpatient office facility       History of present illness (HPI) Michele Velazquez, a 23 y.o. year old female, is here today because of her Chronic bilateral low back pain with right-sided sciatica [G89.29, M54.41]. Michele Velazquez primary complain today is No chief complaint on file.  Pertinent problems: Michele Velazquez has Foot drop (Right); Lumbar nerve root impingement (Right: S1); Chronic pain syndrome; Low back pain of over 3 months duration; Low back pain radiating to right leg; Chronic low back pain (1ry area of Pain) (Bilateral) w/ sciatica (Right); Abnormal MRI, lumbar spine (09/22/2023); Chronic lower extremity pain (Right); and Subacute lumbar radiculopathy (Right) on their pertinent problem list.  Pain Assessment: Severity of   is reported as a  /10. Location:    / . Onset:  . Quality:  . Timing:  . Modifying factor(s):  SABRA Vitals:  vitals were not taken for this visit.  BMI: Estimated body mass index is 39.54 kg/m as calculated from the following:   Height as of 12/23/23: 5' 6 (1.676 m).   Weight as of 12/23/23: 245 lb (111.1 kg).  Last encounter: 12/23/2023. Last procedure: 12/23/2023.  Reason for encounter: post-procedure evaluation and assessment.   Discussed the use of AI  scribe software for clinical note transcription with the patient, who gave verbal consent to proceed.  History of Present Illness          Post-Procedure Evaluation   Type: Lumbar epidural steroid injection (LESI) (interlaminar) #1    Laterality: Right   Level:  L4-5 Level.  Imaging: Fluoroscopic guidance Spinal ([id number]) Anesthesia: Local anesthesia (1-2% Lidocaine ) Anxiolysis: IV Versed  3.0 mg Sedation: Minimal Sedation Fentanyl  1 mL (50 mcg) DOS: 12/23/2023  Performed by: Eric DELENA Como, MD  Purpose: Diagnostic/Therapeutic Indications: Lumbar radicular pain of intraspinal etiology of more than 4 weeks that has failed to respond to conservative therapy and is severe enough to impact quality of life or function. 1. 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 Subacute lumbar radiculopathy (Right)   3. Chronic lower extremity pain (Right)   4. Foot drop (Right)   5. Low back pain of over 3 months duration   6. Low back pain radiating to right leg   7. Lumbar nerve root impingement   8. Abnormal MRI, lumbar spine (09/22/2023)   9. Obesity, Class III, BMI 40-49.9 (morbid obesity) (HCC)    NAS-11 Pain score:   Pre-procedure: 8 /10   Post-procedure: 0-No pain/10   09/22/2023 LUMBAR MRI IMPRESSION: 1. At L5-S1, severe right subarticular recess stenosis with impingement of the descending right S1 nerve roots. Moderate left subarticular recess stenosis and mild left foraminal stenosis. 2. At L4-5, right far lateral foraminal disc protrusion with likely at least moderate right foraminal stenosis. The exiting/exited right L4 nerve appears inflamed/edematous. Moderate left and mild right (L>R) subarticular  recess stenosis. 3. At L3-4, moderate left and mild right (L>R) subarticular recess stenosis. 4. At L2-3, moderate left subarticular recess stenosis.    Effectiveness:  Initial hour after procedure:   ***. Subsequent 4-6 hours post-procedure:    ***. Analgesia past initial 6 hours:   ***. Ongoing improvement:  Analgesic:  *** Function:    ***    ROM:    ***    Interpretation: ***  Pharmacotherapy Assessment   Opioid Analgesic: None MME/day: 0 mg/day   Monitoring: [place] PMP: PDMP not reviewed this encounter.       Pharmacotherapy: No side-effects or adverse reactions reported. Compliance: No problems identified. Effectiveness: Clinically acceptable.  No notes on file  UDS:  Summary  Date Value Ref Range Status  12/15/2023 FINAL  Final    Comment:    ==================================================================== Compliance Drug Analysis, Ur ==================================================================== Test                             Result       Flag       Units  Drug Present and Declared for Prescription Verification   Ibuprofen                       PRESENT      EXPECTED  Drug Present not Declared for Prescription Verification   Carboxy-THC                    493          UNEXPECTED ng/mg creat    Carboxy-THC is a metabolite of tetrahydrocannabinol (THC). Source of    THC is most commonly herbal marijuana or marijuana-based products,    but THC is also present in a scheduled prescription medication.    Trace amounts of THC can be present in hemp and cannabidiol (CBD)    products. This test is not intended to distinguish between delta-9-    tetrahydrocannabinol, the predominant form of THC in most herbal or    marijuana-based products, and delta-8-tetrahydrocannabinol.  ==================================================================== Test                      Result    Flag   Units      Ref Range   Creatinine              122              mg/dL      >=79 ==================================================================== Declared Medications:  The flagging and interpretation on this report are based on the  following declared medications.  Unexpected results may arise from  inaccuracies in the  declared medications.   **Note: The testing scope of this panel does not include small to  moderate amounts of these reported medications:   Ibuprofen  (Advil ) ==================================================================== For clinical consultation, please call [phone]. ====================================================================     No results found for: CBDTHCR No results found for: D8THCCBX No results found for: D9THCCBX  ROS  Constitutional: Denies any fever or chills Gastrointestinal: No reported hemesis, hematochezia, vomiting, or acute GI distress Musculoskeletal: Denies any acute onset joint swelling, redness, loss of ROM, or weakness Neurological: No reported episodes of acute onset apraxia, aphasia, dysarthria, agnosia, amnesia, paralysis, loss of coordination, or loss of consciousness  Medication Review  ibuprofen   History Review  Allergy: Michele Velazquez is allergic to penicillins, sulfa antibiotics, and bactrim [sulfamethoxazole-trimethoprim]. Drug: Michele Velazquez  has no history  on file for drug use. Alcohol:  reports no history of alcohol use. Tobacco:  reports that she has never smoked. She has never used smokeless tobacco. Social: Michele Velazquez  reports that she has never smoked. She has never used smokeless tobacco. She reports that she does not drink alcohol. Medical:  has no past medical history on file. Surgical: Michele Velazquez  has no past surgical history on file. Family: family history is not on file.  Laboratory Chemistry Profile   Renal Lab Results  Component Value Date   BUN 14 12/15/2023   CREATININE 0.83 12/15/2023   BCR 17 12/15/2023   GFRNONAA >60 11/08/2021    Hepatic Lab Results  Component Value Date   AST 15 12/15/2023   ALBUMIN 4.7 12/15/2023   ALKPHOS 42 12/15/2023    Electrolytes Lab Results  Component Value Date   NA 143 12/15/2023   K 4.2 12/15/2023   CL 104 12/15/2023   CALCIUM 9.8 12/15/2023   MG 2.1  12/15/2023    Bone Lab Results  Component Value Date   25OHVITD1 16 (L) 12/15/2023   25OHVITD2 <1.0 12/15/2023   25OHVITD3 15 12/15/2023    Inflammation (CRP: Acute Phase) (ESR: Chronic Phase) Lab Results  Component Value Date   CRP 10 12/15/2023   ESRSEDRATE 35 (H) 12/15/2023         Note: Above Lab results reviewed.  Recent Imaging Review  DG PAIN CLINIC C-ARM 1-60 MIN NO REPORT Fluoro was used, but no Radiologist interpretation will be provided.  Please refer to NOTES tab for provider progress note. Note: Reviewed        Physical Exam  Vitals: There were no vitals taken for this visit. BMI: Estimated body mass index is 39.54 kg/m as calculated from the following:   Height as of 12/23/23: 5' 6 (1.676 m).   Weight as of 12/23/23: 245 lb (111.1 kg). Ideal: Patient weight not recorded General appearance: Well nourished, well developed, and well hydrated. In no apparent acute distress Mental status: Alert, oriented x 3 (person, place, & time)       Respiratory: No evidence of acute respiratory distress Eyes: PERLA   Assessment   Diagnosis Status  1. 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 Subacute lumbar radiculopathy (Right)   3. Chronic lower extremity pain (Right)   4. Foot drop (Right)   5. Low back pain of over 3 months duration   6. Low back pain radiating to right leg   7. Postop check    Controlled Controlled Controlled   Updated Problems: No problems updated.  Plan of Care  Problem-specific:  Assessment and Plan            Michele Velazquez is not on any long-term medications.  Pharmacotherapy (Medications Ordered): No orders of the defined types were placed in this encounter.  Orders:  No orders of the defined types were placed in this encounter.    Interventional Therapies  Risk Factors  Considerations  Medical Comorbidities:     Planned  Pending:   Diagnostic/therapeutic right L4-5 LESI #1     Under consideration:   Diagnostic/therapeutic right L4-5 LESI #1    Completed: (Analgesic benefit)1  None at this time   Therapeutic  Palliative (PRN) options:   None established   Completed by other providers:   None reported  1(Analgesic benefit): Expressed in percentage (%). (Local anesthetic[LA] +/- sedation  L.A.Local Anesthetic  Steroid benefit  Ongoing benefit)   No follow-ups on file.    Recent Visits Date Type Provider Dept  12/23/23 Procedure visit Tanya Glisson, MD [facility]  12/15/23 Office Visit Tanya Glisson, MD [facility]  Showing recent visits within past 90 days and meeting all other requirements Future Appointments Date Type Provider Dept  01/31/24 Appointment Tanya Glisson, MD [facility]  Showing future appointments within next 90 days and meeting all other requirements  I discussed the assessment and treatment plan with the patient. The patient was provided an opportunity to ask questions and all were answered. The patient agreed with the plan and demonstrated an understanding of the instructions.  Patient advised to call back or seek an in-person evaluation if the symptoms or condition worsens.  Duration of encounter: *** minutes.  Total time on encounter, as per AMA guidelines included both the face-to-face and non-face-to-face time personally spent by the physician and/or other qualified health care professional(s) on the day of the encounter (includes time in activities that require the physician or other qualified health care professional and does not include time in activities normally performed by clinical staff). Physician's time may include the following activities when performed: Preparing to see the patient (e.g., pre-charting review of records, searching for previously ordered imaging, lab work, and nerve conduction tests) Review of prior analgesic pharmacotherapies. Reviewing PMP Interpreting  ordered tests (e.g., lab work, imaging, nerve conduction tests) Performing post-procedure evaluations, including interpretation of diagnostic procedures Obtaining and/or reviewing separately obtained history Performing a medically appropriate examination and/or evaluation Counseling and educating the patient/family/caregiver Ordering medications, tests, or procedures Referring and communicating with other health care professionals (when not separately reported) Documenting clinical information in the electronic or other health record Independently interpreting results (not separately reported) and communicating results to the patient/ family/caregiver Care coordination (not separately reported)  Note by: Glisson DELENA Tanya, MD (TTS and AI technology used. I apologize for any typographical errors that were not detected and corrected.) Date: 01/31/2024; Time: 12:59 PM

## 2024-01-31 ENCOUNTER — Encounter: Payer: Self-pay | Admitting: Pain Medicine

## 2024-01-31 ENCOUNTER — Ambulatory Visit: Attending: Pain Medicine | Admitting: Pain Medicine

## 2024-01-31 VITALS — BP 116/90 | HR 83 | Temp 98.1°F | Resp 18 | Ht 65.0 in | Wt 245.0 lb

## 2024-01-31 DIAGNOSIS — M5416 Radiculopathy, lumbar region: Secondary | ICD-10-CM

## 2024-01-31 DIAGNOSIS — M79604 Pain in right leg: Secondary | ICD-10-CM

## 2024-01-31 DIAGNOSIS — M21371 Foot drop, right foot: Secondary | ICD-10-CM | POA: Insufficient documentation

## 2024-01-31 DIAGNOSIS — M5441 Lumbago with sciatica, right side: Secondary | ICD-10-CM

## 2024-01-31 DIAGNOSIS — Z09 Encounter for follow-up examination after completed treatment for conditions other than malignant neoplasm: Secondary | ICD-10-CM | POA: Insufficient documentation

## 2024-01-31 DIAGNOSIS — G8929 Other chronic pain: Secondary | ICD-10-CM | POA: Diagnosis present

## 2024-01-31 DIAGNOSIS — M545 Low back pain, unspecified: Secondary | ICD-10-CM | POA: Insufficient documentation

## 2024-01-31 NOTE — Patient Instructions (Signed)
 ______________________________________________________________________    Procedure instructions  Stop blood-thinners  Do not eat or drink fluids (other than water ) for 6 hours before your procedure  No water  for 2 hours before your procedure  Take your blood pressure medicine with a sip of water   Arrive 30 minutes before your appointment  If sedation is planned, bring suitable driver. [place], [place], & public transportation are NOT APPROVED)  Carefully read the Preparing for your procedure detailed instructions  If you have questions call us  at [phone]  Procedure appointments are for procedures only.   NO medication refills or new problem evaluations will be done on procedure days.   Only the scheduled, pre-approved procedure and side will be done.   ______________________________________________________________________     ______________________________________________________________________    Preparing for your procedure  Appointments: If you think you may not be able to keep your appointment, call 24-48 hours in advance to cancel. We need time to make it available to others.  Procedure visits are for procedures only. During your procedure appointment there will be: NO Prescription Refills*. NO medication changes or discussions*. NO discussion of disability issues*. NO unrelated pain problem evaluations*. NO evaluations to order other pain procedures*. *These will be addressed at a separate and distinct evaluation encounter on the provider's evaluation schedule and not during procedure days.  Instructions: Food intake: Avoid eating anything solid for at least 8 hours prior to your procedure. Clear liquid intake: You may take clear liquids such as water  up to 2 hours prior to your procedure. (No carbonated drinks. No soda.) Transportation: Unless otherwise stated by your physician, bring a driver. (Driver cannot be a Market researcher, Pharmacist, community, or any other form of public  transportation.) Morning Medicines: Except for blood thinners, take all of your other morning medications with a sip of water . Make sure to take your heart and blood pressure medicines. If your blood pressure's lower number is above 100, the case will be rescheduled. Blood thinners: Make sure to stop your blood thinners as instructed.  If you take a blood thinner, but were not instructed to stop it, call our office [phone] and ask to talk to a nurse. Not stopping a blood thinner prior to certain procedures could lead to serious complications. Diabetics on insulin : Notify the staff so that you can be scheduled 1st case in the morning. If your diabetes requires high dose insulin , take only  of your normal insulin  dose the morning of the procedure and notify the staff that you have done so. Preventing infections: Shower with an antibacterial soap the morning of your procedure.  Build-up your immune system: Take 1000 mg of Vitamin C with every meal (3 times a day) the day prior to your procedure. Antibiotics: Inform the nursing staff if you are taking any antibiotics or if you have any conditions that may require antibiotics prior to procedures. (Example: recent joint implants)   Pregnancy: If you are pregnant make sure to notify the nursing staff. Not doing so may result in injury to the fetus, including death.  Sickness: If you have a cold, fever, or any active infections, call and cancel or reschedule your procedure. Receiving steroids while having an infection may result in complications. Arrival: You must be in the facility at least 30 minutes prior to your scheduled procedure. Tardiness: Your scheduled time is also the cutoff time. If you do not arrive at least 15 minutes prior to your procedure, you will be rescheduled.  Children: Do not bring any children with  you. Make arrangements to keep them home. Dress appropriately: There is always a possibility that your clothing may get soiled. Avoid  long dresses. Valuables: Do not bring any jewelry or valuables.  Reasons to call and reschedule or cancel your procedure: (Following these recommendations will minimize the risk of a serious complication.) Surgeries: Avoid having procedures within 2 weeks of any surgery. (Avoid for 2 weeks before or after any surgery). Flu Shots: Avoid having procedures within 2 weeks of a flu shots or . (Avoid for 2 weeks before or after immunizations). Barium: Avoid having a procedure within 7-10 days after having had a radiological study involving the use of radiological contrast. (Myelograms, Barium swallow or enema study). Heart attacks: Avoid any elective procedures or surgeries for the initial 6 months after a Myocardial Infarction (Heart Attack). Blood thinners: It is imperative that you stop these medications before procedures. Let us  know if you if you take any blood thinner.  Infection: Avoid procedures during or within two weeks of an infection (including chest colds or gastrointestinal problems). Symptoms associated with infections include: Localized redness, fever, chills, night sweats or profuse sweating, burning sensation when voiding, cough, congestion, stuffiness, runny nose, sore throat, diarrhea, nausea, vomiting, cold or Flu symptoms, recent or current infections. It is specially important if the infection is over the area that we intend to treat. Heart and lung problems: Symptoms that may suggest an active cardiopulmonary problem include: cough, chest pain, breathing difficulties or shortness of breath, dizziness, ankle swelling, uncontrolled high or unusually low blood pressure, and/or palpitations. If you are experiencing any of these symptoms, cancel your procedure and contact your primary care physician for an evaluation.  Remember:  Regular Business hours are:  Monday to Thursday 8:00 AM to 4:00 PM  Provider's Schedule: Eric Como, MD:  Procedure days: Tuesday and Thursday 7:30  AM to 4:00 PM  Wallie Sherry, MD:  Procedure days: Monday and Wednesday 7:30 AM to 4:00 PM Last  Updated: 01/26/2023 ______________________________________________________________________     ______________________________________________________________________    General Risks and Possible Complications  Patient Responsibilities: It is important that you read this as it is part of your informed consent. It is our duty to inform you of the risks and possible complications associated with treatments offered to you. It is your responsibility as a patient to read this and to ask questions about anything that is not clear or that you believe was not covered in this document.  Patient's Rights: You have the right to refuse treatment. You also have the right to change your mind, even after initially having agreed to have the treatment done. However, under this last option, if you wait until the last second to change your mind, you may be charged for the materials used up to that point.  Introduction: Medicine is not an Visual merchandiser. Everything in Medicine, including the lack of treatment(s), carries the potential for danger, harm, or loss (which is by definition: Risk). In Medicine, a complication is a secondary problem, condition, or disease that can aggravate an already existing one. All treatments carry the risk of possible complications. The fact that a side effects or complications occurs, does not imply that the treatment was conducted incorrectly. It must be clearly understood that these can happen even when everything is done following the highest safety standards.  No treatment: You can choose not to proceed with the proposed treatment alternative. The "PRO(s)" would include: avoiding the risk of complications associated with the therapy. The "CON(s)" would include:  not getting any of the treatment benefits. These benefits fall under one of three categories: diagnostic; therapeutic; and/or  palliative. Diagnostic benefits include: getting information which can ultimately lead to improvement of the disease or symptom(s). Therapeutic benefits are those associated with the successful treatment of the disease. Finally, palliative benefits are those related to the decrease of the primary symptoms, without necessarily curing the condition (example: decreasing the pain from a flare-up of a chronic condition, such as incurable terminal cancer).  General Risks and Complications: These are associated to most interventional treatments. They can occur alone, or in combination. They fall under one of the following six (6) categories: no benefit or worsening of symptoms; bleeding; infection; nerve damage; allergic reactions; and/or death. No benefits or worsening of symptoms: In Medicine there are no guarantees, only probabilities. No healthcare provider can ever guarantee that a medical treatment will work, they can only state the probability that it may. Furthermore, there is always the possibility that the condition may worsen, either directly, or indirectly, as a consequence of the treatment. Bleeding: This is more common if the patient is taking a blood thinner, either prescription or over the counter (example: Goody Powders, Fish oil, Aspirin, Garlic, etc.), or if suffering a condition associated with impaired coagulation (example: Hemophilia, cirrhosis of the liver, low platelet counts, etc.). However, even if you do not have one on these, it can still happen. If you have any of these conditions, or take one of these drugs, make sure to notify your treating physician. Infection: This is more common in patients with a compromised immune system, either due to disease (example: diabetes, cancer, human immunodeficiency virus [HIV], etc.), or due to medications or treatments (example: therapies used to treat cancer and rheumatological diseases). However, even if you do not have one on these, it can still  happen. If you have any of these conditions, or take one of these drugs, make sure to notify your treating physician. Nerve Damage: This is more common when the treatment is an invasive one, but it can also happen with the use of medications, such as those used in the treatment of cancer. The damage can occur to small secondary nerves, or to large primary ones, such as those in the spinal cord and brain. This damage may be temporary or permanent and it may lead to impairments that can range from temporary numbness to permanent paralysis and/or brain death. Allergic Reactions: Any time a substance or material comes in contact with our body, there is the possibility of an allergic reaction. These can range from a mild skin rash (contact dermatitis) to a severe systemic reaction (anaphylactic reaction), which can result in death. Death: In general, any medical intervention can result in death, most of the time due to an unforeseen complication. ______________________________________________________________________      ______________________________________________________________________    Steroid injections  Common steroids for injections Triamcinolone: Used by many sports medicine physicians for large joint and bursal injections, often combined with a local anesthetic like lidocaine . A study focusing on coccydynia (tailbone pain) found triamcinolone was more effective than betamethasone , suggesting it may also be preferable for other localized inflammation conditions. Methylprednisolone: A common alternative to triamcinolone that is also a strong anti-inflammatory. It is available in different formulations, with the acetate suspension being the long-acting option for intra-articular injections. Dexamethasone : This is a non-particulate steroid, meaning it has a lower risk of tissue damage compared to particulate steroids like triamcinolone and methylprednisolone. While less common for this specific  use,  it is an option for targeted injections.   Considerations for physicians Particulate vs. non-particulate steroids: Triamcinolone and methylprednisolone are particulate, meaning they can clump together. Dexamethasone  is non-particulate. Particulate steroids are often preferred for their longer-lasting effects but carry a theoretical higher risk for certain injections (though this is less of a concern in the costochondral joints). Combined injectate: Corticosteroids are typically mixed with a local anesthetic like lidocaine  to provide both immediate pain relief (from the anesthetic) and longer-term inflammation reduction (from the steroid). Imaging guidance: To ensure accurate placement of the needle and medication, physicians may use ultrasound or fluoroscopic guidance for the injection, especially in complex or refractory cases.   Patient guidance Before undergoing a steroid injection, discuss the options with your physician. They will determine the best steroid, dosage, and procedure for your specific case based on factors like: Severity of your condition History of response to other treatments Your overall health status Experience and preference of the physician  Last  Updated: 10/12/2023 ______________________________________________________________________

## 2024-02-08 ENCOUNTER — Ambulatory Visit: Admitting: Neurosurgery

## 2024-02-23 ENCOUNTER — Telehealth: Payer: Self-pay

## 2024-02-23 NOTE — Telephone Encounter (Signed)
 Insurance Treatment Denial Note  Date order was entered:  Order entered by: Eric Como, MD,Seema Tobie, NP Requested treatment: lesi Reason for denial: Documentation of Physical Therapy is missing. Recommended for approval: PT   Need notes from the doctor that say your current pain score is at least a 6 ( it was documented at 3). The doctor must also provide notes saying what things you are not able to do because of your low back pain. Your doctor must also provide notes showing you did formal physical therapy since the last injection or give a medical reason why you cannot do physical therapy. Without this information your request did not meet criteria for approval.

## 2024-03-06 ENCOUNTER — Other Ambulatory Visit: Payer: Self-pay | Admitting: Pain Medicine

## 2024-03-06 DIAGNOSIS — G8929 Other chronic pain: Secondary | ICD-10-CM

## 2024-03-06 NOTE — Telephone Encounter (Signed)
 Left vm asking patient to call me regarding PT
